# Patient Record
Sex: Female | Born: 1975 | Race: White | Hispanic: No | Smoking: Never smoker
Health system: Southern US, Community
[De-identification: ages and names within clinical notes are randomized; demographics above are authoritative.]

## PROBLEM LIST (undated history)

## (undated) DIAGNOSIS — G43509 Persistent migraine aura without cerebral infarction, not intractable, without status migrainosus: Secondary | ICD-10-CM

## (undated) DIAGNOSIS — F32A Depression, unspecified: Secondary | ICD-10-CM

## (undated) DIAGNOSIS — K219 Gastro-esophageal reflux disease without esophagitis: Secondary | ICD-10-CM

## (undated) DIAGNOSIS — E559 Vitamin D deficiency, unspecified: Secondary | ICD-10-CM

## (undated) DIAGNOSIS — K589 Irritable bowel syndrome without diarrhea: Secondary | ICD-10-CM

## (undated) DIAGNOSIS — B019 Varicella without complication: Secondary | ICD-10-CM

## (undated) DIAGNOSIS — D369 Benign neoplasm, unspecified site: Secondary | ICD-10-CM

## (undated) DIAGNOSIS — F329 Major depressive disorder, single episode, unspecified: Secondary | ICD-10-CM

## (undated) HISTORY — PX: OTHER SURGICAL HISTORY: SHX169

## (undated) HISTORY — DX: Irritable bowel syndrome, unspecified: K58.9

## (undated) HISTORY — DX: Varicella without complication: B01.9

## (undated) HISTORY — DX: Persistent migraine aura without cerebral infarction, not intractable, without status migrainosus: G43.509

## (undated) HISTORY — DX: Benign neoplasm, unspecified site: D36.9

## (undated) HISTORY — DX: Depression, unspecified: F32.A

## (undated) HISTORY — DX: Major depressive disorder, single episode, unspecified: F32.9

## (undated) HISTORY — DX: Vitamin D deficiency, unspecified: E55.9

## (undated) HISTORY — DX: Gastro-esophageal reflux disease without esophagitis: K21.9

## (undated) HISTORY — PX: ABDOMINAL HYSTERECTOMY: SHX81

---

## 1993-06-12 HISTORY — PX: CYST REMOVAL NECK: SHX6281

## 2003-06-13 HISTORY — PX: OTHER SURGICAL HISTORY: SHX169

## 2005-06-12 HISTORY — PX: CHOLECYSTECTOMY: SHX55

## 2015-09-10 ENCOUNTER — Encounter: Payer: Self-pay | Admitting: Emergency Medicine

## 2015-09-10 ENCOUNTER — Emergency Department
Admission: EM | Admit: 2015-09-10 | Discharge: 2015-09-10 | Disposition: A | Discharge status: Home / Self Care | Payer: Medicaid Other | Attending: Student | Admitting: Student

## 2015-09-10 DIAGNOSIS — T814XXA Infection following a procedure, initial encounter: Secondary | ICD-10-CM | POA: Diagnosis not present

## 2015-09-10 DIAGNOSIS — Y829 Unspecified medical devices associated with adverse incidents: Secondary | ICD-10-CM | POA: Diagnosis not present

## 2015-09-10 DIAGNOSIS — L908 Other atrophic disorders of skin: Secondary | ICD-10-CM | POA: Insufficient documentation

## 2015-09-10 DIAGNOSIS — L089 Local infection of the skin and subcutaneous tissue, unspecified: Secondary | ICD-10-CM

## 2015-09-10 DIAGNOSIS — M542 Cervicalgia: Secondary | ICD-10-CM | POA: Diagnosis present

## 2015-09-10 DIAGNOSIS — L905 Scar conditions and fibrosis of skin: Secondary | ICD-10-CM

## 2015-09-10 DIAGNOSIS — T798XXA Other early complications of trauma, initial encounter: Secondary | ICD-10-CM

## 2015-09-10 LAB — CBC WITH DIFFERENTIAL/PLATELET
BASOS ABS: 0.1 10*3/uL (ref 0–0.1)
BASOS PCT: 1 %
EOS ABS: 0.2 10*3/uL (ref 0–0.7)
Eosinophils Relative: 2 %
HCT: 42.6 % (ref 35.0–47.0)
HEMOGLOBIN: 14 g/dL (ref 12.0–16.0)
Lymphocytes Relative: 19 %
Lymphs Abs: 1.9 10*3/uL (ref 1.0–3.6)
MCH: 27.5 pg (ref 26.0–34.0)
MCHC: 32.8 g/dL (ref 32.0–36.0)
MCV: 83.6 fL (ref 80.0–100.0)
Monocytes Absolute: 0.5 10*3/uL (ref 0.2–0.9)
Monocytes Relative: 5 %
NEUTROS PCT: 73 %
Neutro Abs: 7.3 10*3/uL — ABNORMAL HIGH (ref 1.4–6.5)
Platelets: 209 10*3/uL (ref 150–440)
RBC: 5.09 MIL/uL (ref 3.80–5.20)
RDW: 13.4 % (ref 11.5–14.5)
WBC: 10 10*3/uL (ref 3.6–11.0)

## 2015-09-10 LAB — BASIC METABOLIC PANEL
Anion gap: 5 (ref 5–15)
BUN: 23 mg/dL — ABNORMAL HIGH (ref 6–20)
CALCIUM: 9.3 mg/dL (ref 8.9–10.3)
CHLORIDE: 104 mmol/L (ref 101–111)
CO2: 28 mmol/L (ref 22–32)
CREATININE: 0.86 mg/dL (ref 0.44–1.00)
Glucose, Bld: 87 mg/dL (ref 65–99)
Potassium: 3.7 mmol/L (ref 3.5–5.1)
SODIUM: 137 mmol/L (ref 135–145)

## 2015-09-10 MED ORDER — SULFAMETHOXAZOLE-TRIMETHOPRIM 800-160 MG PO TABS: 1.0000 | ORAL_TABLET | Freq: Two times a day (BID) | ORAL | Status: DC

## 2015-09-10 MED ORDER — SULFAMETHOXAZOLE-TRIMETHOPRIM 800-160 MG PO TABS
1.0000 | ORAL_TABLET | Freq: Once | ORAL | Status: AC
  Administered 2015-09-10: 1 via ORAL
  Filled 2015-09-10: qty 1

## 2015-09-10 MED ORDER — ONDANSETRON 8 MG PO TBDP
8.0000 mg | ORAL_TABLET | Freq: Once | ORAL | Status: AC
Start: 2015-09-10 — End: 2015-09-10
  Administered 2015-09-10: 8 mg via ORAL
  Filled 2015-09-10: qty 1

## 2015-09-10 MED ORDER — ONDANSETRON HCL 8 MG PO TABS: 8.0000 mg | ORAL_TABLET | Freq: Three times a day (TID) | ORAL | Status: DC | PRN

## 2015-09-10 NOTE — ED Provider Notes (Signed)
[facility] Emergency Department Provider Note  ____________________________________________  Time seen: Approximately 1:30 PM  I have reviewed the triage vital signs and the nursing notes.   HISTORY  Chief Complaint Neck Pain    HPI Linda Cochran is a 40 y.o. female patient complaining of posterior neck pain status post blunt to the tattoo parlor for "Scarification" image. Patient stated airbag came infected and she is placed on clindamycin and Bactroban. Patient state this was repeated after the first dosage was finished. Patient state she finished the last round of antibiotics 3 days ago. Patient state redness swelling and pain has returned. Patient denies any discharge from his complaint. No palliative measures for this complaint in the past 3 days. Patient rates the pain as a 6/10. Describes pain as sharp. Patient refuses any pain medication at this time.   History reviewed. No pertinent past medical history.  There are no active problems to display for this patient.   History reviewed. No pertinent past surgical history.  Current Outpatient Rx  Name  Route  Sig  Dispense  Refill  . ondansetron (ZOFRAN) 8 MG tablet   Oral   Take 1 tablet (8 mg total) by mouth every 8 (eight) hours as needed for nausea or vomiting.   20 tablet   0   . sulfamethoxazole-trimethoprim (BACTRIM DS,SEPTRA DS) 800-160 MG tablet   Oral   Take 1 tablet by mouth 2 (two) times daily.   20 tablet   0     Allergies Codeine; Dilaudid; and Penicillins  History reviewed. No pertinent family history.  Social History Social History  Substance Use Topics  . Smoking status: Never Smoker   . Smokeless tobacco: None  . Alcohol Use: No    Review of Systems Constitutional: No fever/chills Eyes: No visual changes. ENT: No sore throat. Cardiovascular: Denies chest pain. Respiratory: Denies shortness of breath. Gastrointestinal: No abdominal pain.  No nausea, no vomiting.   No diarrhea.  No constipation. Genitourinary: Negative for dysuria. Musculoskeletal: Negative for back pain. Skin: Negative for rash. Edema and erythema posterior neck. Neurological: Negative for headaches, focal weakness or numbness. Hematological/allergic to codeine, Dilaudid, and penicillins.  10-point ROS otherwise negative.  ____________________________________________   PHYSICAL EXAM:  VITAL SIGNS: ED Triage Vitals  Enc Vitals Group     BP 09/10/15 1247 133/68 mmHg     Pulse Rate 09/10/15 1247 82     Resp 09/10/15 1247 20     Temp 09/10/15 1247 98.1 F (36.7 C)     Temp Source 09/10/15 1247 Oral     SpO2 09/10/15 1247 98 %     Weight --      Height --      Head Cir --      Peak Flow --      Pain Score 09/10/15 1247 6     Pain Loc --      Pain Edu? --      Excl. in [place]? --     Constitutional: Alert and oriented. Well appearing and in no acute distress. Eyes: Conjunctivae are normal. PERRL. EOMI. Head: Atraumatic. Nose: No congestion/rhinnorhea. Mouth/Throat: Mucous membranes are moist.  Oropharynx non-erythematous. Neck: No stridor.  No cervical spine tenderness to palpation. Hematological/Lymphatic/Immunilogical: No cervical lymphadenopathy. Cardiovascular: Normal rate, regular rhythm. Grossly normal heart sounds.  Good peripheral circulation. Respiratory: Normal respiratory effort.  No retractions. Lungs CTAB. Gastrointestinal: Soft and nontender. No distention. No abdominal bruits. No CVA tenderness. Musculoskeletal: No lower extremity tenderness nor edema.  No joint effusions. Neurologic:  Normal speech and language. No gross focal neurologic deficits are appreciated. No gait instability. Skin:  Skin is warm, dry and intact. No rash noted. Psychiatric: Mood and affect are normal. Speech and behavior are normal.  ____________________________________________   LABS (all labs ordered are listed, but only abnormal results are displayed)  Labs Reviewed  BASIC  METABOLIC PANEL - Abnormal; Notable for the following:    BUN 23 (*)    All other components within normal limits  CBC WITH DIFFERENTIAL/PLATELET - Abnormal; Notable for the following:    Neutro Abs 7.3 (*)    All other components within normal limits   ____________________________________________  EKG   ____________________________________________  RADIOLOGY   ____________________________________________   PROCEDURES  Procedure(s) performed: None  Critical Care performed: No  ____________________________________________   INITIAL IMPRESSION / ASSESSMENT AND PLAN / ED COURSE  Pertinent labs & imaging results that were available during my care of the patient were reviewed by me and considered in my medical decision making (see chart for details).  Infected scar tissue. Patient given discharge care instructions. Patient given prescription for Bactrim DS and Zofran. Patient will follow with dermatology calling for an appointment next week. ____________________________________________   FINAL CLINICAL IMPRESSION(S) / ED DIAGNOSES  Final diagnoses:  Post-traumatic skin infection, initial encounter [facility])  Scar of neck       Sable Feil, PA-C 09/10/15 [place], MD 09/10/15 [id number]

## 2015-09-10 NOTE — Discharge Instructions (Signed)
Wound Infection A wound infection happens when a type of germ (bacteria) grows in a wound. Caring for the infection can help the wound heal. Wound infections need treatment. HOME CARE   Only take medicine as told by your doctor.  Take your antibiotic medicine as told. Finish it even if you start to feel better.  Clean the wound with mild soap and water as told. Rinse the soap off. Pat the area dry with a clean towel. Do not rub the wound.  Change any bandages (dressings) as told by your doctor.  Put cream and a bandage on the wound as told by your doctor.  If the bandage sticks, wet it with soapy water to remove the bandage.  Change the bandage if it gets wet, dirty, or starts to smell.  Take showers. Do not take baths, swim, or do anything that puts your wound under water.  Avoid exercise that makes you sweat.  If your wound itches, use a medicine that helps stop itching. Do not pick or scratch at the wound.  Keep all doctor visits as told. GET HELP RIGHT AWAY IF:   You have more puffiness (swelling), pain, or redness around the wound.  You have more yellowish-white fluid (pus) coming from the wound.  You have a bad smell coming from the wound.  Your wound breaks open more.  You have a fever. MAKE SURE YOU:   Understand these instructions.  Will watch your condition.  Will get help right away if you are not doing well or get worse.   This information is not intended to replace advice given to you by your health care provider. Make sure you discuss any questions you have with your health care provider.   Document Released: 03/07/2008 Document Revised: 08/21/2011 Document Reviewed: 11/16/2014 Elsevier Interactive Patient Education 2016 [place] will have a scar anytime you have surgery and a cut is made in the skin or you have something removed from your skin (mole, skin cancer, cyst). Although scars are unavoidable following surgery,  there are ways to minimize their appearance. It is important to follow all the instructions you receive from your caregiver about wound care. How your wound heals will influence the appearance of your scar. If you do not follow the wound care instructions as directed, complications such as infection may occur. Wound instructions include keeping the wound clean, moist, and not letting the wound form a scab. Some people form scars that are raised and lumpy (hypertrophic) or larger than the initial wound (keloidal). HOME CARE INSTRUCTIONS   Follow wound care instructions as directed.  Keep the wound clean by washing it with soap and water.  Keep the wound moist with provided antibiotic cream or petroleum jelly until completely healed. Moisten twice a day for about 2 weeks.  Get stitches (sutures) taken out at the scheduled time.  Avoid touching or manipulating your wound unless needed. Wash your hands thoroughly before and after touching your wound.  Follow all restrictions such as limits on exercise or work. This depends on where your scar is located.  Keep the scar protected from sunburn. Cover the scar with sunscreen/sunblock with SPF 30 or higher.  Gently massage the scar using a circular motion to help minimize the appearance of the scar. Do this only after the wound has closed and all the sutures have been removed.  For hypertrophic or keloidal scars, there are several ways to treat and minimize their appearance. Methods include compression therapy,  intralesional corticosteroids, laser therapy, or surgery. These methods are performed by your caregiver. Remember that the scar may appear lighter or darker than your normal skin color. This difference in color should even out with time. SEEK MEDICAL CARE IF:   You have a fever.  You develop signs of infection such as pain, redness, pus, and warmth.  You have questions or concerns.   This information is not intended to replace advice  given to you by your health care provider. Make sure you discuss any questions you have with your health care provider.   Document Released: 11/16/2009 Document Revised: 08/21/2011 Document Reviewed: 12/30/2014 Elsevier Interactive Patient Education [employer].

## 2015-09-10 NOTE — ED Notes (Signed)
Pt to ed with c/o neck pain after going to a tattoo parlor and getting a "scarification" in the shape of a star on the back of her neck.  Pt states about 3 weeks ago it became red, warm to touch, and painful.  Was started on abx and topical cream and reports last night, started with fever again.

## 2016-01-14 ENCOUNTER — Emergency Department: Payer: Medicaid Other

## 2016-01-14 ENCOUNTER — Encounter: Payer: Self-pay | Admitting: Emergency Medicine

## 2016-01-14 ENCOUNTER — Emergency Department
Admission: EM | Admit: 2016-01-14 | Discharge: 2016-01-15 | Disposition: A | Discharge status: Home / Self Care | Payer: Medicaid Other | Attending: Student in an Organized Health Care Education/Training Program | Admitting: Student in an Organized Health Care Education/Training Program

## 2016-01-14 DIAGNOSIS — R1032 Left lower quadrant pain: Secondary | ICD-10-CM | POA: Diagnosis present

## 2016-01-14 DIAGNOSIS — R42 Dizziness and giddiness: Secondary | ICD-10-CM

## 2016-01-14 DIAGNOSIS — R197 Diarrhea, unspecified: Secondary | ICD-10-CM

## 2016-01-14 DIAGNOSIS — R112 Nausea with vomiting, unspecified: Secondary | ICD-10-CM | POA: Insufficient documentation

## 2016-01-14 DIAGNOSIS — K529 Noninfective gastroenteritis and colitis, unspecified: Secondary | ICD-10-CM | POA: Diagnosis not present

## 2016-01-14 DIAGNOSIS — R1110 Vomiting, unspecified: Secondary | ICD-10-CM

## 2016-01-14 DIAGNOSIS — E860 Dehydration: Secondary | ICD-10-CM

## 2016-01-14 LAB — COMPREHENSIVE METABOLIC PANEL WITH GFR
ALT: 20 U/L (ref 14–54)
AST: 24 U/L (ref 15–41)
Albumin: 4.4 g/dL (ref 3.5–5.0)
Alkaline Phosphatase: 56 U/L (ref 38–126)
Anion gap: 11 (ref 5–15)
BUN: 19 mg/dL (ref 6–20)
CO2: 24 mmol/L (ref 22–32)
Calcium: 9.3 mg/dL (ref 8.9–10.3)
Chloride: 106 mmol/L (ref 101–111)
Creatinine, Ser: 0.9 mg/dL (ref 0.44–1.00)
GFR calc Af Amer: >60 mL/min
GFR calc non Af Amer: >60 mL/min
Glucose, Bld: 100 mg/dL — ABNORMAL HIGH (ref 65–99)
Potassium: 3.4 mmol/L — ABNORMAL LOW (ref 3.5–5.1)
Sodium: 141 mmol/L (ref 135–145)
Total Bilirubin: 0.7 mg/dL (ref 0.3–1.2)
Total Protein: 8 g/dL (ref 6.5–8.1)

## 2016-01-14 LAB — CBC
HEMATOCRIT: 45.2 % (ref 35.0–47.0)
Hemoglobin: 15.2 g/dL (ref 12.0–16.0)
MCH: 28.5 pg (ref 26.0–34.0)
MCHC: 33.7 g/dL (ref 32.0–36.0)
MCV: 84.8 fL (ref 80.0–100.0)
Platelets: 206 10*3/uL (ref 150–440)
RBC: 5.33 MIL/uL — ABNORMAL HIGH (ref 3.80–5.20)
RDW: 12.9 % (ref 11.5–14.5)
WBC: 17.4 10*3/uL — ABNORMAL HIGH (ref 3.6–11.0)

## 2016-01-14 LAB — URINALYSIS COMPLETE WITH MICROSCOPIC (ARMC ONLY)
Bilirubin Urine: NEGATIVE
Glucose, UA: NEGATIVE mg/dL
Leukocytes, UA: NEGATIVE
NITRITE: NEGATIVE
PH: 6 (ref 5.0–8.0)
PROTEIN: 30 mg/dL — AB
SPECIFIC GRAVITY, URINE: 1.024 (ref 1.005–1.030)

## 2016-01-14 LAB — LIPASE, BLOOD: Lipase: 25 U/L (ref 11–51)

## 2016-01-14 MED ORDER — PROMETHAZINE HCL 25 MG/ML IJ SOLN
12.5000 mg | Freq: Once | INTRAMUSCULAR | Status: AC
  Administered 2016-01-14: 12.5 mg via INTRAVENOUS
  Filled 2016-01-14: qty 1

## 2016-01-14 MED ORDER — SODIUM CHLORIDE 0.9 % IV BOLUS (SEPSIS)
1000.0000 mL | Freq: Once | INTRAVENOUS | Status: AC
  Administered 2016-01-14: 1000 mL via INTRAVENOUS

## 2016-01-14 MED ORDER — DIATRIZOATE MEGLUMINE & SODIUM 66-10 % PO SOLN
15.0000 mL | ORAL | Status: AC
Start: 2016-01-14 — End: 2016-01-15

## 2016-01-14 MED ORDER — PROMETHAZINE HCL 25 MG/ML IJ SOLN
12.5000 mg | Freq: Once | INTRAMUSCULAR | Status: AC
  Administered 2016-01-14: 12.5 mg via INTRAVENOUS

## 2016-01-14 MED ORDER — ONDANSETRON 4 MG PO TBDP
4.0000 mg | ORAL_TABLET | Freq: Once | ORAL | Status: AC | PRN
  Administered 2016-01-14: 4 mg via ORAL
  Filled 2016-01-14: qty 1

## 2016-01-14 MED ORDER — IOPAMIDOL (ISOVUE-300) INJECTION 61%
100.0000 mL | Freq: Once | INTRAVENOUS | Status: AC | PRN
  Administered 2016-01-14: 100 mL via INTRAVENOUS

## 2016-01-14 NOTE — ED Triage Notes (Signed)
Pt arrived via pov with complaints of n/v/d. Pt ambulated to triage room with no assistance. Pt states diarrhea started Wednesday, nausea started yesterday, and vomiting started today. Pt states throwing up 12-15 times today. Per pt unable to hold anything down. Pt states taking 8mg  zofran at 4 with no relief.

## 2016-01-14 NOTE — ED Provider Notes (Signed)
[facility] Emergency Department Provider Note    First MD Initiated Contact with Patient 01/14/16 2148     (approximate)  I have reviewed the triage vital signs and the nursing notes.   HISTORY  Chief Complaint Nausea; Abdominal Pain; and Dizziness    HPI Linda Cochran is a 40 y.o. female who presents with 3 days of worsening nausea vomiting. Patient states that the symptoms initially started with watery diarrhea after the patient was at [facility] park this past weekend. The diarrhea persisted patient was initially able to tolerate by mouth for the first few days, however she developed left lower quadrant pain as well as nausea and recurrent vomiting. Unable to keep anything down for the past day. Denies any fevers or chest pain. Denies any dysuria. Patient is status post hysterectomy. States that she does have ovarian cysts but states that this pain is different from previous and describes this as more of aching cramping pain. Denies any blood in her stools. States that she does have a family history of IBS.   History reviewed. No pertinent past medical history.  There are no active problems to display for this patient.   Past Surgical History:  Procedure Laterality Date  . ABDOMINAL HYSTERECTOMY    . CHOLECYSTECTOMY    . uterine prolapse repair      Prior to Admission medications   Medication Sig Start Date End Date Taking? Authorizing Provider  ondansetron (ZOFRAN) 8 MG tablet Take 1 tablet (8 mg total) by mouth every 8 (eight) hours as needed for nausea or vomiting. 09/10/15   Sable Feil, PA-C  sulfamethoxazole-trimethoprim (BACTRIM DS,SEPTRA DS) 800-160 MG tablet Take 1 tablet by mouth 2 (two) times daily. 09/10/15   Sable Feil, PA-C    Allergies Codeine; Dilaudid [hydromorphone hcl]; and Penicillins    Social History Social History  Substance Use Topics  . Smoking status: Never Smoker  . Smokeless tobacco: Not on file  .  Alcohol use No    Review of Systems Patient denies headaches, rhinorrhea, blurry vision, numbness, shortness of breath, chest pain, edema, cough, abdominal pain, nausea, vomiting, diarrhea, dysuria, fevers, rashes or hallucinations unless otherwise stated above in HPI. ____________________________________________   PHYSICAL EXAM:  VITAL SIGNS: Vitals:   01/14/16 1949  BP: 130/89  Pulse: 91  Resp: 20  Temp: 97.6 F (36.4 C)   Constitutional: Alert and oriented. Ill appearing but in NAD Eyes: Conjunctivae are normal. PERRL. EOMI. Head: Atraumatic. Nose: No congestion/rhinnorhea. Mouth/Throat: Mucous membranes are moist.  Oropharynx non-erythematous. Neck: No stridor. Painless ROM. No cervical spine tenderness to palpation Hematological/Lymphatic/Immunilogical: No cervical lymphadenopathy. Cardiovascular: Normal rate, regular rhythm. Grossly normal heart sounds.  Good peripheral circulation. Respiratory: Normal respiratory effort.  No retractions. Lungs CTAB. Gastrointestinal: Soft with no guarding, llq ttp. No distention. No abdominal bruits. No CVA tenderness. Musculoskeletal: No lower extremity tenderness nor edema.  No joint effusions. Neurologic:  Normal speech and language. No gross focal neurologic deficits are appreciated. No gait instability. Skin:  Skin is warm, dry and intact. No rash noted. Psychiatric: Mood and affect are normal. Speech and behavior are normal.  ____________________________________________   LABS (all labs ordered are listed, but only abnormal results are displayed)  Results for orders placed or performed during the hospital encounter of 01/14/16 (from the past 24 hour(s))  Lipase, blood     Status: None   Collection Time: 01/14/16  8:24 PM  Result Value Ref Range   Lipase 25 11 -  51 U/L  Comprehensive metabolic panel     Status: Abnormal   Collection Time: 01/14/16  8:24 PM  Result Value Ref Range   Sodium 141 135 - 145 mmol/L   Potassium  3.4 (L) 3.5 - 5.1 mmol/L   Chloride 106 101 - 111 mmol/L   CO2 24 22 - 32 mmol/L   Glucose, Bld 100 (H) 65 - 99 mg/dL   BUN 19 6 - 20 mg/dL   Creatinine, Ser 0.90 0.44 - 1.00 mg/dL   Calcium 9.3 8.9 - 10.3 mg/dL   Total Protein 8.0 6.5 - 8.1 g/dL   Albumin 4.4 3.5 - 5.0 g/dL   AST 24 15 - 41 U/L   ALT 20 14 - 54 U/L   Alkaline Phosphatase 56 38 - 126 U/L   Total Bilirubin 0.7 0.3 - 1.2 mg/dL   GFR calc non Af Amer >60 >60 mL/min   GFR calc Af Amer >60 >60 mL/min   Anion gap 11 5 - 15  CBC     Status: Abnormal   Collection Time: 01/14/16  8:24 PM  Result Value Ref Range   WBC 17.4 (H) 3.6 - 11.0 K/uL   RBC 5.33 (H) 3.80 - 5.20 MIL/uL   Hemoglobin 15.2 12.0 - 16.0 g/dL   HCT 45.2 35.0 - 47.0 %   MCV 84.8 80.0 - 100.0 fL   MCH 28.5 26.0 - 34.0 pg   MCHC 33.7 32.0 - 36.0 g/dL   RDW 12.9 11.5 - 14.5 %   Platelets 206 150 - 440 K/uL  Urinalysis complete, with microscopic     Status: Abnormal   Collection Time: 01/14/16  8:36 PM  Result Value Ref Range   Color, Urine YELLOW (A) YELLOW   APPearance CLEAR (A) CLEAR   Glucose, UA NEGATIVE NEGATIVE mg/dL   Bilirubin Urine NEGATIVE NEGATIVE   Ketones, ur 2+ (A) NEGATIVE mg/dL   Specific Gravity, Urine 1.024 1.005 - 1.030   Hgb urine dipstick 1+ (A) NEGATIVE   pH 6.0 5.0 - 8.0   Protein, ur 30 (A) NEGATIVE mg/dL   Nitrite NEGATIVE NEGATIVE   Leukocytes, UA NEGATIVE NEGATIVE   RBC / HPF 0-5 0 - 5 RBC/hpf   WBC, UA 0-5 0 - 5 WBC/hpf   Bacteria, UA RARE (A) NONE SEEN   Squamous Epithelial / LPF 0-5 (A) NONE SEEN   Mucous PRESENT    ____________________________________________  ____________________________________________  RADIOLOGY  IMPRESSION: 1. Fluid-filled large and small bowel without obstruction or inflammation. Findings are most consistent with diarrheal illness. 2. Small hiatal hernia. ____________________________________________   PROCEDURES  Procedure(s) performed: none    Critical Care performed:  no ____________________________________________   INITIAL IMPRESSION / ASSESSMENT AND PLAN / ED COURSE  Pertinent labs & imaging results that were available during my care of the patient were reviewed by me and considered in my medical decision making (see chart for details).  [id number], colitis, gastroenteritis, ovarian cyst, UTI, abscess  Linda Cochran is a 40 y.o. who presents to the ED with 4 days of nausea vomiting diarrhea. Laboratory evaluation ordered due to concern for acute intra-abdominal process. Patient does have marked leukocytosis but is afebrile at this time. Based on her several days of profuse diarrhea associated with nausea and vomiting and marked leukocytosis to go further imaging is clinically indicated to evaluate for diverticulitis versus colitis. Mentation is less consistent with ovarian pathology at this time. We'll order IV fluids as well as anti-emetics. Patient declines any pain medication at the  time.  Clinical Course   ----------------------------------------- 12:45 AM on 01/15/2016 -----------------------------------------  Reassessed patient remains hemodynamically stable. CT imaging reviewed with patient. Presentation more consistent with viral enteritis. Patient still with persistent nausea and vomiting. Have ordered repeat Phenergan. Will try IV Ativan. IV fluids still infusing.  Discussed with patient if next dose of antibiotics does not produce symptoms will need admission to hospital for hyperemesis.  Case discussed with Dr. Dahlia Client in sign out.  Have discussed with the patient and available family all diagnostics and treatments performed thus far and all questions were answered to the best of my ability. The patient demonstrates understanding and agreement with plan.   ____________________________________________   FINAL CLINICAL IMPRESSION(S) / ED DIAGNOSES  Final diagnoses:  Intractable vomiting with nausea, vomiting of unspecified type   Gastroenteritis      NEW MEDICATIONS STARTED DURING THIS VISIT:  New Prescriptions   No medications on file     Note:  This document was prepared using Dragon voice recognition software and may include unintentional dictation errors.    Merlyn Lot, MD 01/15/16 [id number]

## 2016-01-14 NOTE — ED Notes (Signed)
Patient transported to CT 

## 2016-01-14 NOTE — ED Notes (Signed)
Pt reports contract making nausea worse, confirmed with MD to proceed with CT without, CT called

## 2016-01-15 MED ORDER — HALOPERIDOL LACTATE 5 MG/ML IJ SOLN: 5.0000 mg | Freq: Once | INTRAMUSCULAR | Status: DC

## 2016-01-15 MED ORDER — LORAZEPAM 2 MG/ML IJ SOLN
2.0000 mg | Freq: Once | INTRAMUSCULAR | Status: AC
  Administered 2016-01-15: 2 mg via INTRAVENOUS
  Filled 2016-01-15: qty 1

## 2016-01-15 MED ORDER — KCL IN DEXTROSE-NACL 20-5-0.45 MEQ/L-%-% IV SOLN
Freq: Once | INTRAVENOUS | Status: AC
  Administered 2016-01-15: 01:00:00 via INTRAVENOUS
  Filled 2016-01-15: qty 1000

## 2016-01-15 MED ORDER — LORAZEPAM 1 MG PO TABS: 1.0000 mg | ORAL_TABLET | Freq: Once | ORAL | Status: DC

## 2016-01-15 MED ORDER — LORAZEPAM 0.5 MG PO TABS
0.5000 mg | ORAL_TABLET | Freq: Three times a day (TID) | ORAL | 0 refills | Status: AC | PRN
Start: 2016-01-15 — End: 2017-01-14

## 2016-01-15 NOTE — ED Provider Notes (Signed)
-----------------------------------------   4:32 AM on 01/15/2016 -----------------------------------------   Blood pressure 108/78, pulse 93, temperature 97.6 F (36.4 C), temperature source Oral, resp. rate 16, height 5\' 6"  (1.676 m), weight 182 lb (82.6 kg), SpO2 97 %.  Assuming care from Dr. Quentin Cornwall.  In short, Linda Cochran is a 40 y.o. female with a chief complaint of Nausea; Abdominal Pain; and Dizziness .  Refer to the original H&P for additional details.  The current plan of care is to reassess the patient after she's receive her fluids.  Patient did receive a dose of Ativan from Dr. Quentin Cornwall for her nausea and dizziness as well which was helpful. After multiple hours in the ER the patient's fluids are not running quickly and continually occlude. As the patient is feeling better and has been able to tolerate some sips of fluid I will discharge her home and have her orally hydrate at home. The patient should follow-up with her primary care physician. The patient agrees with this plan as stated and she'll be discharged to home.    Loney Hering, MD 01/15/16 [phone]

## 2017-01-10 DIAGNOSIS — G43909 Migraine, unspecified, not intractable, without status migrainosus: Secondary | ICD-10-CM | POA: Insufficient documentation

## 2017-01-10 DIAGNOSIS — R42 Dizziness and giddiness: Secondary | ICD-10-CM | POA: Insufficient documentation

## 2017-02-08 DIAGNOSIS — K6289 Other specified diseases of anus and rectum: Secondary | ICD-10-CM | POA: Insufficient documentation

## 2017-06-12 HISTORY — PX: COLONOSCOPY: SHX174

## 2017-08-21 ENCOUNTER — Other Ambulatory Visit: Payer: Self-pay | Admitting: Physician Assistant

## 2017-08-21 DIAGNOSIS — Z1231 Encounter for screening mammogram for malignant neoplasm of breast: Secondary | ICD-10-CM

## 2017-09-10 ENCOUNTER — Ambulatory Visit: Payer: Medicaid Other

## 2017-09-19 ENCOUNTER — Ambulatory Visit: Payer: Medicaid Other

## 2017-10-15 ENCOUNTER — Ambulatory Visit: Payer: Self-pay

## 2018-04-15 ENCOUNTER — Telehealth: Payer: Self-pay | Admitting: Obstetrics & Gynecology

## 2018-04-15 NOTE — Telephone Encounter (Signed)
We have received records for [facility], Attempt to reach patient to schedule transfer care lvm per Kenney Houseman. 04/12/18.

## 2018-04-24 ENCOUNTER — Ambulatory Visit: Payer: Medicaid Other

## 2018-05-13 ENCOUNTER — Ambulatory Visit (INDEPENDENT_AMBULATORY_CARE_PROVIDER_SITE_OTHER): Payer: Medicaid Other | Admitting: Maternal Newborn

## 2018-05-13 ENCOUNTER — Other Ambulatory Visit (HOSPITAL_COMMUNITY)
Admission: RE | Admit: 2018-05-13 | Discharge: 2018-05-13 | Disposition: A | Discharge status: Home / Self Care | Payer: Medicaid Other | Source: Ambulatory Visit | Attending: Maternal Newborn | Admitting: Maternal Newborn

## 2018-05-13 ENCOUNTER — Encounter: Payer: Self-pay | Admitting: Maternal Newborn

## 2018-05-13 VITALS — BP 118/72 | HR 95 | Ht 66.0 in | Wt 197.0 lb

## 2018-05-13 DIAGNOSIS — K582 Mixed irritable bowel syndrome: Secondary | ICD-10-CM

## 2018-05-13 DIAGNOSIS — R350 Frequency of micturition: Secondary | ICD-10-CM | POA: Diagnosis not present

## 2018-05-13 DIAGNOSIS — Z01419 Encounter for gynecological examination (general) (routine) without abnormal findings: Secondary | ICD-10-CM | POA: Insufficient documentation

## 2018-05-13 DIAGNOSIS — N393 Stress incontinence (female) (male): Secondary | ICD-10-CM

## 2018-05-13 DIAGNOSIS — Z0000 Encounter for general adult medical examination without abnormal findings: Secondary | ICD-10-CM | POA: Diagnosis not present

## 2018-05-13 DIAGNOSIS — Z113 Encounter for screening for infections with a predominantly sexual mode of transmission: Secondary | ICD-10-CM | POA: Insufficient documentation

## 2018-05-13 DIAGNOSIS — K6289 Other specified diseases of anus and rectum: Secondary | ICD-10-CM

## 2018-05-13 LAB — POCT URINALYSIS DIPSTICK
BILIRUBIN UA: NEGATIVE
Glucose, UA: NEGATIVE
KETONES UA: NEGATIVE
Leukocytes, UA: NEGATIVE
Nitrite, UA: NEGATIVE
PH UA: 6.5 (ref 5.0–8.0)
Protein, UA: NEGATIVE
RBC UA: NEGATIVE
Spec Grav, UA: 1.015 (ref 1.010–1.025)
UROBILINOGEN UA: 0.2 U/dL

## 2018-05-13 NOTE — Progress Notes (Signed)
Gynecology Annual Exam  PCP: Linda Cochran, No Pcp Per  Chief Complaint:  Chief Complaint  Linda Cochran presents with  . Gynecologic Exam    urine urgency, leaking urine     History of Present Illness: Linda Cochran is a 42 y.o. [id number] presenting for an annual exam.   LMP: No LMP recorded. Linda Cochran has had a hysterectomy.  The Linda Cochran is sexually active; currently has a female partner. She is status post hysterectomy and thus does not need contraception. She denies dyspareunia.  The Linda Cochran does perform self breast exams. There is a history of breast cancer in her paternal aunt. There is no other family history of breast or ovarian cancer in her family.  The Linda Cochran has regular exercise: Not currently, beginning to swim as regular exercise.  The Linda Cochran denies current symptoms of depression.    Review of Systems  Constitutional: Negative.   HENT: Negative.   Eyes: Negative.   Respiratory: Positive for cough and shortness of breath. Negative for wheezing.        Recovering from bronchitis  Cardiovascular: Negative for chest pain and palpitations.  Gastrointestinal: Positive for constipation and diarrhea.       IBS  Genitourinary: Positive for frequency.       Stress urinary incontinence  Skin: Negative.   Neurological: Positive for headaches.       Migraines  Endo/Heme/Allergies: Bruises/bleeds easily.       Hot flashes  Psychiatric/Behavioral: Negative for depression. The Linda Cochran is nervous/anxious.   All other systems reviewed and are negative.   Past Medical History:  Past Medical History:  Diagnosis Date  . Acid reflux   . Chickenpox   . Depression   . IBS (irritable bowel syndrome)   . Migraine aura, persistent     Past Surgical History:  Past Surgical History:  Procedure Laterality Date  . ABDOMINAL HYSTERECTOMY    . CESAREAN SECTION  2005  . CHOLECYSTECTOMY  2007  . CYST REMOVAL NECK  1995  . laproscopy  2005  . uterine prolapse repair      Gynecologic History:    No LMP recorded. Linda Cochran has had a hysterectomy. Contraception: status post hysterectomy Last Pap: 05/18/2015. Results were: ASCUS/HPV Negative  Last mammogram: 2018.  Results were normal per records review.  Obstetric History: G2P1103  Family History:  Family History  Problem Relation Age of Onset  . Kidney failure Mother   . Hypertension Mother   . Migraines Mother   . Skin cancer Mother   . Cervical cancer Mother   . Diabetes Mother   . Hypertension Father   . Diabetes Father   . Breast cancer Paternal Aunt   . Brain cancer Paternal Aunt   . Uterine cancer Maternal Grandmother   . Kidney failure Maternal Grandmother   . Colon cancer Maternal Grandfather   . Skin cancer Maternal Grandfather   . Diabetes Maternal Grandfather   . Kidney failure Paternal Grandfather     Social History:  Social History   Socioeconomic History  . Marital status: Single    Spouse name: Not on file  . Number of children: Not on file  . Years of education: Not on file  . Highest education level: Not on file  Occupational History  . Not on file  Social Needs  . Financial resource strain: Not on file  . Food insecurity:    Worry: Not on file    Inability: Not on file  . Transportation needs:    Medical: Not on file  Non-medical: Not on file  Tobacco Use  . Smoking status: Never Smoker  . Smokeless tobacco: Never Used  Substance and Sexual Activity  . Alcohol use: No  . Drug use: No  . Sexual activity: Yes    Birth control/protection: Surgical  Lifestyle  . Physical activity:    Days per week: Not on file    Minutes per session: Not on file  . Stress: Not on file  Relationships  . Social connections:    Talks on phone: Not on file    Gets together: Not on file    Attends religious service: Not on file    Active member of club or organization: Not on file    Attends meetings of clubs or organizations: Not on file    Relationship status: Not on file  . Intimate partner  violence:    Fear of current or ex partner: Not on file    Emotionally abused: Not on file    Physically abused: Not on file    Forced sexual activity: Not on file  Other Topics Concern  . Not on file  Social History Narrative  . Not on file    Allergies:  Allergies  Allergen Reactions  . Methylprednisolone Other (See Comments)    Paralytic reaction  . Codeine   . Dilaudid [Hydromorphone Hcl]   . Penicillins   . Pantoprazole Sodium Diarrhea and Nausea Only    Stomach cramps    Medications: Prior to Admission medications   Medication Sig Start Date End Date Taking? Authorizing Provider  amitriptyline (ELAVIL) 50 MG tablet TAKE 1 TABLET BY MOUTH EVERYDAY AT BEDTIME 03/28/18  Yes [provider]  cyclobenzaprine (FLEXERIL) 10 MG tablet TAKE 1 TABLET BY MOUTH 3 TIMES A DAY AS NEEDED FOR MUSCLE SPASMS FOR UP TO 10 DAYS. 04/10/18  Yes [provider]  diazepam (VALIUM) 2 MG tablet TAKE 1 TABLET BY MOUTH EVERY 12 HOURS AS NEEDED FOR VERTIGO 05/10/18  Yes [provider]  meloxicam (MOBIC) 15 MG tablet TAKE 1 TABLET BY MOUTH EVERY DAY AS NEEDED FOR PAIN FOR UP TO 30 DAYS 04/19/18  Yes [provider]  omeprazole (PRILOSEC OTC) 20 MG tablet Take by mouth.   Yes [provider]  ondansetron (ZOFRAN-ODT) 8 MG disintegrating tablet Take 8 mg by mouth every 8 (eight) hours as needed. for nausea 03/20/18  Yes [provider]  SUMAtriptan (IMITREX) 100 MG tablet Take by mouth. 12/12/17  Yes [provider]    Physical Exam Vitals: Blood pressure 118/72, pulse 95, height 5\' 6"  (1.676 m), weight 197 lb (89.4 kg).  General: NAD HEENT: normocephalic, anicteric Thyroid: no enlargement Pulmonary: No increased work of breathing, CTAB Cardiovascular: RRR, no murmurs, rubs, or gallops Breast: Breasts symmetrical, no tenderness, no palpable nodules or masses, no skin or nipple retraction present, no nipple discharge.  No axillary or  supraclavicular lymphadenopathy. Abdomen: Soft, non-tender, non-distended.  Umbilicus without lesions.  No hepatomegaly, splenomegaly or masses palpable. No evidence of hernia  Genitourinary:  External: Normal external female genitalia.  Normal urethral  meatus, normal Bartholin's and Skene's glands.    Vagina: Normal vaginal mucosa, no evidence of prolapse.    Cervix: Surgically absent  Uterus: Surgically absent  Adnexa: ovaries non-enlarged, no adnexal masses  Rectal: deferred  Lymphatic: no evidence of inguinal lymphadenopathy Extremities: no edema, erythema, or tenderness Neurologic: Grossly intact Psychiatric: mood appropriate, affect full  Assessment: 42 y.o. [id number] routine annual exam with rectal pain, IBS, and stress urinary  incontinence.  Plan: Problem List Items Addressed This Visit    None    Visit Diagnoses    Women's annual routine gynecological examination    -  Primary   Relevant Orders   HEP, RPR, HIV Panel   Cytology - PAP   Frequent urination       Relevant Orders   POCT Urinalysis Dipstick (Completed)   Screen for STD (sexually transmitted disease)       Relevant Orders   HEP, RPR, HIV Panel   Cytology - PAP   Pap smear, as part of routine gynecological examination       Relevant Orders   Cytology - PAP   Irritable bowel syndrome with both constipation and diarrhea       Relevant Medications   ondansetron (ZOFRAN-ODT) 8 MG disintegrating tablet   omeprazole (PRILOSEC OTC) 20 MG tablet   Other Relevant Orders   Ambulatory referral to Gastroenterology   Rectal pain       Relevant Orders   Ambulatory referral to Gastroenterology      1) Mammogram - recommend yearly screening mammogram.  Mammogram has been ordered by her primary physican to be done at [facility].  2) STI screening was offered and accepted.  3) ASCCP guidelines and rationale discussed.  Linda Cochran opts for every 3 year screening interval.  4) Referral to gastroenterology for  new rectal pain since having surgery to remove a growth and ongoing IBS issues (predominantly constipation but also has diarrhea).  5) Discussed causes and treatment of stress urinary incontinence. Handout given on pelvic floor muscle exercises and encouraged 3 x daily exercises. UA negative today.  6) Routine healthcare maintenance including cholesterol, diabetes screening: managed by PCP.  Return in about 1 year (around 05/14/2019) for Annual exam.  Avel Sensor, CNM 05/13/2018  4:21 PM

## 2018-05-14 LAB — HEP, RPR, HIV PANEL
HIV SCREEN 4TH GENERATION: NONREACTIVE
Hepatitis B Surface Ag: NEGATIVE
RPR: NONREACTIVE

## 2018-05-15 LAB — CYTOLOGY - PAP
CHLAMYDIA, DNA PROBE: NEGATIVE
Diagnosis: NEGATIVE
HPV: NOT DETECTED
NEISSERIA GONORRHEA: NEGATIVE
Trichomonas: NEGATIVE

## 2018-06-18 ENCOUNTER — Ambulatory Visit
Admission: RE | Admit: 2018-06-18 | Discharge: 2018-06-18 | Disposition: A | Discharge status: Home / Self Care | Payer: Medicaid Other | Source: Ambulatory Visit | Attending: Physician Assistant | Admitting: Physician Assistant

## 2018-06-18 DIAGNOSIS — Z1231 Encounter for screening mammogram for malignant neoplasm of breast: Secondary | ICD-10-CM

## 2018-06-20 ENCOUNTER — Ambulatory Visit: Payer: Medicaid Other | Admitting: Gastroenterology

## 2018-07-16 ENCOUNTER — Other Ambulatory Visit: Payer: Self-pay

## 2018-07-16 ENCOUNTER — Ambulatory Visit: Payer: Medicaid Other | Admitting: Gastroenterology

## 2018-07-16 ENCOUNTER — Encounter: Payer: Self-pay | Admitting: Gastroenterology

## 2018-07-16 VITALS — BP 107/76 | HR 71 | Resp 17 | Ht 66.0 in | Wt 186.6 lb

## 2018-07-16 DIAGNOSIS — K580 Irritable bowel syndrome with diarrhea: Secondary | ICD-10-CM | POA: Diagnosis not present

## 2018-07-16 MED ORDER — AMITRIPTYLINE HCL 50 MG PO TABS: ORAL_TABLET | ORAL | 0 refills | Status: DC

## 2018-07-16 NOTE — Progress Notes (Signed)
Cephas Darby, MD [address]  [place]  [place], [place] [address]  Main: [phone]  Fax: [phone]    Gastroenterology Consultation  Referring Provider:     Rexene Agent, CNM Primary Care Physician:  Nicholes Rough, PA-C Primary Gastroenterologist:  Dr. Cephas Darby Reason for Consultation:     IBS flare up        HPI:   Linda Cochran is a 43 y.o. caucasian female referred by Dr. Nicholes Rough, PA-C  for consultation & management of IBS flare up Abdominal pain, cramps, generalized, post prandial urgency past 77months,worse past 3weeks. Poor po intake, nausea, lightheaded. Diagnosed with IBS since she was young, alternating constipation and diarrhea, was using miralax when constipated. She tried keto diet before Thanksgiving as she gained upto 207lbs, her IBS symptoms got worse, quit keto diet after 3 weeks, but symptoms only partially relieved. She tried bentyl 10mg , developed constipation. Her symptoms were manageable prior to going on keto diet. She switched from amitriptyline to propranolol 3weeks ago as her migraines were not under control. She was put on amitriptyline by her neurologist about 3 years ago, had been taking 50mg , couldn't increase the dose due to mood swings and insomnia  195lbs on 06/24/18, 186.6 lbs today Had traumatic vagina delivery with her twin pregnancy Had uterine prolapse, underwent hysterectomy  She was seeing GI in [place], EGD in 2018, gstric polyp was removed, started on omeprazole for heart burn  Had colonoscopy in 2018 at [facility], removal of anal papilla surgically, post op thrombosed external hemorrhoid  NSAIDs: none  Antiplts/Anticoagulants/Anti thrombotics: none  GI Procedures: EGD in [place] in 2018 Colonoscopy in 2018 at [facility] removed surgically  Past Medical History:  Diagnosis Date  . Acid reflux   . Chickenpox   . Depression   . IBS (irritable bowel syndrome)   . Migraine aura, persistent      Past Surgical History:  Procedure Laterality Date  . ABDOMINAL HYSTERECTOMY    . CESAREAN SECTION  2005  . CHOLECYSTECTOMY  2007  . CYST REMOVAL NECK  1995  . laproscopy  2005  . uterine prolapse repair      Current Outpatient Medications:  .  cyclobenzaprine (FLEXERIL) 10 MG tablet, TAKE 1 TABLET BY MOUTH 3 TIMES A DAY AS NEEDED FOR MUSCLE SPASMS FOR UP TO 10 DAYS., Disp: , Rfl: 1 .  diazepam (VALIUM) 2 MG tablet, TAKE 1 TABLET BY MOUTH EVERY 12 HOURS AS NEEDED FOR VERTIGO, Disp: , Rfl:  .  omeprazole (PRILOSEC OTC) 20 MG tablet, Take by mouth., Disp: , Rfl:  .  ondansetron (ZOFRAN-ODT) 8 MG disintegrating tablet, Take 8 mg by mouth every 8 (eight) hours as needed. for nausea, Disp: , Rfl: 3 .  propranolol ER (INDERAL LA) 60 MG 24 hr capsule, Take by mouth., Disp: , Rfl:  .  rizatriptan (MAXALT-MLT) 10 MG disintegrating tablet, Take by mouth., Disp: , Rfl:  .  amitriptyline (ELAVIL) 50 MG tablet, TAKE 1 TABLET BY MOUTH EVERYDAY AT BEDTIME, Disp: 90 tablet, Rfl: 0 .  meloxicam (MOBIC) 15 MG tablet, TAKE 1 TABLET BY MOUTH EVERY DAY AS NEEDED FOR PAIN FOR UP TO 30 DAYS, Disp: , Rfl: 1 .  SUMAtriptan (IMITREX) 100 MG tablet, Take by mouth., Disp: , Rfl:  .  topiramate (TOPAMAX) 50 MG tablet, Take 50 mg by mouth at bedtime., Disp: , Rfl:    Family History  Problem Relation Age of Onset  . Kidney failure Mother   .  Hypertension Mother   . Migraines Mother   . Skin cancer Mother   . Cervical cancer Mother   . Diabetes Mother   . Hypertension Father   . Diabetes Father   . Breast cancer Paternal Aunt   . Brain cancer Paternal Aunt   . Uterine cancer Maternal Grandmother   . Kidney failure Maternal Grandmother   . Colon cancer Maternal Grandfather   . Skin cancer Maternal Grandfather   . Diabetes Maternal Grandfather   . Kidney failure Paternal Grandfather      Social History   Tobacco Use  . Smoking status: Never Smoker  . Smokeless tobacco: Never Used  Substance Use  Topics  . Alcohol use: No  . Drug use: No    Allergies as of 07/16/2018 - Review Complete 07/16/2018  Allergen Reaction Noted  . Methylprednisolone Other (See Comments) 01/17/2017  . Codeine  09/10/2015  . Dilaudid [hydromorphone hcl]  09/10/2015  . Penicillins  09/10/2015  . Pantoprazole sodium Diarrhea and Nausea Only 01/10/2017    Review of Systems:    All systems reviewed and negative except where noted in HPI.   Physical Exam:  BP 107/76 (BP Location: Left Arm, Patient Position: Sitting, Cuff Size: Large)   Pulse 71   Resp 17   Ht 5\' 6"  (1.676 m)   Wt 186 lb 9.6 oz (84.6 kg)   BMI 30.12 kg/m  No LMP recorded. Patient has had a hysterectomy.  General:   Alert,  Well-developed, well-nourished, pleasant and cooperative in NAD Head:  Normocephalic and atraumatic. Eyes:  Sclera clear, no icterus.   Conjunctiva pink. Ears:  Normal auditory acuity. Nose:  No deformity, discharge, or lesions. Mouth:  No deformity or lesions,oropharynx pink & moist. Neck:  Supple; no masses or thyromegaly. Lungs:  Respirations even and unlabored.  Clear throughout to auscultation.   No wheezes, crackles, or rhonchi. No acute distress. Heart:  Regular rate and rhythm; no murmurs, clicks, rubs, or gallops. Abdomen:  Normal bowel sounds. Soft, mild diffuse tenderness and non-distended without masses, hepatosplenomegaly or hernias noted.  No guarding or rebound tenderness.   Rectal: Not performed Msk:  Symmetrical without gross deformities. Good, equal movement & strength bilaterally. Pulses:  Normal pulses noted. Extremities:  No clubbing or edema.  No cyanosis. Neurologic:  Alert and oriented x3;  grossly normal neurologically. Skin:  Intact without significant lesions or rashes. No jaundice. Psych:  Alert and cooperative. Normal mood and affect.  Imaging Studies: Reviewed  Assessment and Plan:   Linda Cochran is a 43 y.o. white female with h/o IBS-mixed type, chronic migraine HAs,  previously on amitriptyline 50mg , recently switched to propranolol 60mg  and pt is not tolerating it well. Her migraines are not under control. I think her worsening of symptoms are probably secondary to stopping amitriptyline.  Pt is willing to restart amitriptyline, will restart at her previous dose. Discontinue propranolol as it does not seem to be helping.    Follow up in 3 months   Cephas Darby, MD

## 2018-07-19 ENCOUNTER — Ambulatory Visit: Payer: Medicaid Other | Admitting: Gastroenterology

## 2018-10-15 ENCOUNTER — Ambulatory Visit: Payer: Medicaid Other | Admitting: Gastroenterology

## 2018-11-23 ENCOUNTER — Other Ambulatory Visit: Payer: Self-pay | Admitting: Gastroenterology

## 2018-11-23 DIAGNOSIS — K580 Irritable bowel syndrome with diarrhea: Secondary | ICD-10-CM

## 2018-12-14 ENCOUNTER — Other Ambulatory Visit: Payer: Self-pay | Admitting: Gastroenterology

## 2018-12-14 DIAGNOSIS — K580 Irritable bowel syndrome with diarrhea: Secondary | ICD-10-CM

## 2018-12-26 ENCOUNTER — Ambulatory Visit: Payer: Medicaid Other | Admitting: Gastroenterology

## 2019-09-30 DIAGNOSIS — M0609 Rheumatoid arthritis without rheumatoid factor, multiple sites: Secondary | ICD-10-CM | POA: Insufficient documentation

## 2019-11-19 ENCOUNTER — Ambulatory Visit: Payer: Medicaid Other | Admitting: Obstetrics and Gynecology

## 2019-12-17 ENCOUNTER — Ambulatory Visit (INDEPENDENT_AMBULATORY_CARE_PROVIDER_SITE_OTHER): Payer: Medicaid Other | Admitting: Obstetrics and Gynecology

## 2019-12-17 ENCOUNTER — Other Ambulatory Visit (HOSPITAL_COMMUNITY)
Admission: RE | Admit: 2019-12-17 | Discharge: 2019-12-17 | Disposition: A | Discharge status: Home / Self Care | Payer: Medicaid Other | Source: Ambulatory Visit | Attending: Obstetrics and Gynecology | Admitting: Obstetrics and Gynecology

## 2019-12-17 ENCOUNTER — Encounter: Payer: Self-pay | Admitting: Obstetrics and Gynecology

## 2019-12-17 ENCOUNTER — Other Ambulatory Visit: Payer: Self-pay

## 2019-12-17 VITALS — BP 100/70 | Ht 65.5 in | Wt 189.0 lb

## 2019-12-17 DIAGNOSIS — Z124 Encounter for screening for malignant neoplasm of cervix: Secondary | ICD-10-CM | POA: Insufficient documentation

## 2019-12-17 DIAGNOSIS — Z1231 Encounter for screening mammogram for malignant neoplasm of breast: Secondary | ICD-10-CM

## 2019-12-17 DIAGNOSIS — Z01419 Encounter for gynecological examination (general) (routine) without abnormal findings: Secondary | ICD-10-CM

## 2019-12-17 DIAGNOSIS — Z131 Encounter for screening for diabetes mellitus: Secondary | ICD-10-CM

## 2019-12-17 DIAGNOSIS — Z1151 Encounter for screening for human papillomavirus (HPV): Secondary | ICD-10-CM | POA: Insufficient documentation

## 2019-12-17 DIAGNOSIS — N951 Menopausal and female climacteric states: Secondary | ICD-10-CM

## 2019-12-17 DIAGNOSIS — Z0000 Encounter for general adult medical examination without abnormal findings: Secondary | ICD-10-CM

## 2019-12-17 DIAGNOSIS — Z803 Family history of malignant neoplasm of breast: Secondary | ICD-10-CM | POA: Insufficient documentation

## 2019-12-17 DIAGNOSIS — Z780 Asymptomatic menopausal state: Secondary | ICD-10-CM

## 2019-12-17 NOTE — Patient Instructions (Signed)
I value your feedback and entrusting us with your care. If you get a [facility] patient survey, I would appreciate you taking the time to let us know about your experience today. Thank you!  As of May 22, 2019, your lab results will be released to your MyChart immediately, before I even have a chance to see them. Please give me time to review them and contact you if there are any abnormalities. Thank you for your patience.   [facility]: [phone]  [facility]: [phone]  

## 2019-12-17 NOTE — Progress Notes (Signed)
PCP:  Nicholes Rough, PA-C   Chief Complaint  Patient presents with  . Gynecologic Exam    hot flashes are epic per pt, weight gain     HPI:      Ms. Linda Cochran is a 44 y.o. [id number] whose LMP was No LMP recorded. Patient has had a hysterectomy., presents today for her annual examination.  Her menses are absent due to [facility] in 2013 due to uterine prolapse and mass/AUB. No PMB, cramping. She has been having terrible hot flashes and night sweats, as well as joint aches, difficulty sleeping and wt gain the past 3 months. Had normal TSH, CMP, CBC with different MDs in past few months. Seeing rheumatology due to arthralgias. Also has a hx of migraines, worse recently with "hormonal changes".   Sex activity: single partner, contraception - status post hysterectomy.  Last Pap: not recent; hx of cx dysplasia prior to [facility]; no recent pap but was getting them in [place] with previous GYN after hyst. Hx of STDs: HPV  Last mammogram: 06/18/18  Results were: normal--routine follow-up in 12 months There is a FH of breast cancer in her pat aunt. Pt states she had neg genetic testing with previous GYN about 5 yrs ago. Question BRCA only vs panel (pt to see if she can get records). There is no FH of ovarian cancer. The patient does do self-breast exams.  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   Tobacco use: The patient denies current or previous tobacco use. Alcohol use: social drinker No drug use.  Exercise: moderately active  She does get adequate calcium and Vitamin D in her diet. Hx of Vit D deficiency.    Past Medical History:  Diagnosis Date  . Acid reflux   . Chickenpox   . Depression   . IBS (irritable bowel syndrome)   . Migraine aura, persistent   . Papilloma    anus  . Vitamin D deficiency     Past Surgical History:  Procedure Laterality Date  . ABDOMINAL HYSTERECTOMY    . CESAREAN SECTION  2005  . CHOLECYSTECTOMY  2007  . COLONOSCOPY  2019    . CYST REMOVAL NECK  1995  . laproscopy  2005  . uterine prolapse repair      Family History  Problem Relation Age of Onset  . Kidney failure Mother   . Hypertension Mother   . Migraines Mother   . Skin cancer Mother   . Cervical cancer Mother   . Diabetes Mother   . Hypertension Father   . Diabetes Father   . Breast cancer Paternal Aunt   . Brain cancer Paternal Aunt   . Uterine cancer Maternal Grandmother   . Kidney failure Maternal Grandmother   . Colon cancer Maternal Grandfather   . Skin cancer Maternal Grandfather   . Diabetes Maternal Grandfather   . Kidney failure Paternal Grandfather     Social History   Socioeconomic History  . Marital status: Single    Spouse name: Not on file  . Number of children: Not on file  . Years of education: Not on file  . Highest education level: Not on file  Occupational History  . Not on file  Tobacco Use  . Smoking status: Never Smoker  . Smokeless tobacco: Never Used  Vaping Use  . Vaping Use: Never used  Substance and Sexual Activity  . Alcohol use: No  . Drug use: No  . Sexual activity:  Yes    Birth control/protection: Surgical    Comment: Hysterectomy  Other Topics Concern  . Not on file  Social History Narrative  . Not on file   Social Determinants of Health   Financial Resource Strain:   . Difficulty of Paying Living Expenses:   Food Insecurity:   . Worried About Charity fundraiser in the Last Year:   . Arboriculturist in the Last Year:   Transportation Needs:   . Film/video editor (Medical):   Marland Kitchen Lack of Transportation (Non-Medical):   Physical Activity:   . Days of Exercise per Week:   . Minutes of Exercise per Session:   Stress:   . Feeling of Stress :   Social Connections:   . Frequency of Communication with Friends and Family:   . Frequency of Social Gatherings with Friends and Family:   . Attends Religious Services:   . Active Member of Clubs or Organizations:   . Attends Theatre manager Meetings:   Marland Kitchen Marital Status:   Intimate Partner Violence:   . Fear of Current or Ex-Partner:   . Emotionally Abused:   Marland Kitchen Physically Abused:   . Sexually Abused:      Current Outpatient Medications:  .  amitriptyline (ELAVIL) 50 MG tablet, TAKE 1 TABLET BY MOUTH EVERYDAY AT BEDTIME, Disp: 90 tablet, Rfl: 0 .  cyclobenzaprine (FLEXERIL) 10 MG tablet, TAKE 1 TABLET BY MOUTH 3 TIMES A DAY AS NEEDED FOR MUSCLE SPASMS FOR UP TO 10 DAYS., Disp: , Rfl: 1 .  diazepam (VALIUM) 2 MG tablet, TAKE 1 TABLET BY MOUTH EVERY 12 HOURS AS NEEDED FOR VERTIGO, Disp: , Rfl:  .  eletriptan (RELPAX) 40 MG tablet, TAKE 1 TABLET BY MOUTH AS NEEDED. MAY REPEAT IN 2 HOURS IF NECESSARY. NO MORE THAN 2 IN 24 HOURS., Disp: , Rfl:  .  meclizine (ANTIVERT) 12.5 MG tablet, Take by mouth., Disp: , Rfl:  .  montelukast (SINGULAIR) 10 MG tablet, Take by mouth., Disp: , Rfl:  .  omeprazole (PRILOSEC OTC) 20 MG tablet, Take by mouth., Disp: , Rfl:  .  ondansetron (ZOFRAN-ODT) 8 MG disintegrating tablet, Take 8 mg by mouth every 8 (eight) hours as needed. for nausea, Disp: , Rfl: 3 .  promethazine (PHENERGAN) 25 MG suppository, Place rectally., Disp: , Rfl:  .  triamcinolone (NASACORT) 55 MCG/ACT AERO nasal inhaler, two sprays by Both Nostrils route daily., Disp: , Rfl:      ROS:  Review of Systems  Constitutional: Positive for fatigue and unexpected weight change. Negative for fever.  Respiratory: Negative for cough, shortness of breath and wheezing.   Cardiovascular: Negative for chest pain, palpitations and leg swelling.  Gastrointestinal: Negative for blood in stool, constipation, diarrhea, nausea and vomiting.  Endocrine: Negative for cold intolerance, heat intolerance and polyuria.  Genitourinary: Positive for frequency. Negative for dyspareunia, dysuria, flank pain, genital sores, hematuria, menstrual problem, pelvic pain, urgency, vaginal bleeding, vaginal discharge and vaginal pain.    Musculoskeletal: Positive for arthralgias. Negative for back pain, joint swelling and myalgias.  Skin: Negative for rash.  Neurological: Positive for headaches. Negative for dizziness, syncope, light-headedness and numbness.  Hematological: Negative for adenopathy.  Psychiatric/Behavioral: Positive for agitation. Negative for confusion, sleep disturbance and suicidal ideas. The patient is not nervous/anxious.   BREAST: No symptoms   Objective: BP 100/70   Ht 5' 5.5" (1.664 m)   Wt 189 lb (85.7 kg)   BMI 30.97 kg/m    Physical  Exam Constitutional:      Appearance: She is well-developed.  Genitourinary:     Vulva, vagina, right adnexa and left adnexa normal.     No vaginal discharge, erythema or tenderness.     Cervix is absent.     Uterus is absent.     No right or left adnexal mass present.     Right adnexa not tender.     Left adnexa not tender.     Genitourinary Comments: UTERUS/CX SURG REM  Neck:     Thyroid: No thyromegaly.  Cardiovascular:     Rate and Rhythm: Normal rate and regular rhythm.     Heart sounds: Normal heart sounds. No murmur heard.   Pulmonary:     Effort: Pulmonary effort is normal.     Breath sounds: Normal breath sounds.  Chest:     Breasts:        Right: No mass, nipple discharge, skin change or tenderness.        Left: No mass, nipple discharge, skin change or tenderness.  Abdominal:     Palpations: Abdomen is soft.     Tenderness: There is no abdominal tenderness. There is no guarding.  Musculoskeletal:        General: Normal range of motion.     Cervical back: Normal range of motion.  Neurological:     General: No focal deficit present.     Mental Status: She is alert and oriented to person, place, and time.     Cranial Nerves: No cranial nerve deficit.  Skin:    General: Skin is warm and dry.  Psychiatric:        Mood and Affect: Mood normal.        Behavior: Behavior normal.        Thought Content: Thought content normal.         Judgment: Judgment normal.  Vitals reviewed.     Assessment/Plan: Encounter for annual routine gynecological examination  Cervical cancer screening - Plan: Cytology - PAP  Screening for HPV (human papillomavirus) - Plan: Cytology - PAP; hx of cx dysplasia prior to hyst   Encounter for screening mammogram for malignant neoplasm of breast - Plan: MM 3D SCREEN BREAST BILATERAL; pt to sched mammo  Family history of breast cancer--pt to clarify her genetic testing results. May qualify for updated testing.    Menopause - Plan: FSH/LH, Estradiol; check to see status due to sx. Will f/u with results.   Vasomotor symptoms due to [facility] labs. If neg, will discuss tx options.   Blood tests for routine general physical examination - Plan: FSH/LH, Estradiol, Hemoglobin A1c  Screening for diabetes mellitus - Plan: Hemoglobin A1c            GYN counsel breast self exam, mammography screening, menopause, adequate intake of calcium and vitamin D, diet and exercise     F/U  Return in about 1 year (around 12/16/2020).  Eryn Marandola B. Nykeem Citro, PA-C 12/17/2019 2:52 PM

## 2019-12-18 LAB — HEMOGLOBIN A1C
Est. average glucose Bld gHb Est-mCnc: 117 mg/dL
Hgb A1c MFr Bld: 5.7 % — ABNORMAL HIGH (ref 4.8–5.6)

## 2019-12-18 LAB — FSH/LH
FSH: 6.2 m[IU]/mL
LH: 7.3 m[IU]/mL

## 2019-12-18 LAB — ESTRADIOL: Estradiol: 74.5 pg/mL

## 2019-12-22 LAB — CYTOLOGY - PAP
Comment: NEGATIVE
High risk HPV: NEGATIVE

## 2020-01-20 ENCOUNTER — Other Ambulatory Visit: Payer: Self-pay

## 2020-01-20 ENCOUNTER — Ambulatory Visit (INDEPENDENT_AMBULATORY_CARE_PROVIDER_SITE_OTHER): Payer: Medicaid Other | Admitting: Obstetrics and Gynecology

## 2020-01-20 ENCOUNTER — Encounter: Payer: Self-pay | Admitting: Obstetrics and Gynecology

## 2020-01-20 VITALS — BP 126/82 | Wt 187.0 lb

## 2020-01-20 DIAGNOSIS — R87612 Low grade squamous intraepithelial lesion on cytologic smear of cervix (LGSIL): Secondary | ICD-10-CM | POA: Diagnosis not present

## 2020-01-20 NOTE — Progress Notes (Signed)
Obstetrics & Gynecology Office Visit   Chief Complaint:  Chief Complaint  Patient presents with  . Consult    Abnormal PAP    History of Present Illness:Linda Cochran is a 44 y.o. woman who presents today for continued surveillance for history of dysplasia. Last pap obtained on 12/16/2017 revealed LSIL HPV positive.  Prior pap 05/13/2018 NILM HPV negative.  The patient reports history of prior TAH with prior history of cervical dysplasia.    Pap/Treatment History:  05/13/2018 NILM HPV negative 12/17/2019 LSIL HPV positive    Review of Systems: review of systems negative unless otherwise noted in HPI  Past Medical History:  Patient Active Problem List   Diagnosis Date Noted  . Family history of breast cancer 12/17/2019  . Vasomotor symptoms due to menopause 12/17/2019  . 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 Migraines 01/10/2017  . Vertigo 01/10/2017    Past Surgical History:  Patient Active Problem List   Diagnosis Date Noted  . Family history of breast cancer 12/17/2019  . Vasomotor symptoms due to menopause 12/17/2019  . 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 Migraines 01/10/2017  . Vertigo 01/10/2017    Gynecologic History: No LMP recorded. Patient has had a hysterectomy.  Obstetric History: G2P1103  Family History:  Family History  Problem Relation Age of Onset  . Kidney failure Mother   . Hypertension Mother   . Migraines Mother   . Skin cancer Mother   . Cervical cancer Mother   . Diabetes Mother   . Hypertension Father   . Diabetes Father   . Breast cancer Paternal Aunt   . Brain cancer Paternal Aunt   . Uterine cancer Maternal Grandmother   . Kidney failure Maternal Grandmother   . Colon cancer Maternal Grandfather   . Skin cancer Maternal Grandfather   . Diabetes Maternal Grandfather   . Kidney failure Paternal Grandfather     Social History:  Social History   Socioeconomic History  . Marital status: Single    Spouse name: Not on  file  . Number of children: Not on file  . Years of education: Not on file  . Highest education level: Not on file  Occupational History  . Not on file  Tobacco Use  . Smoking status: Never Smoker  . Smokeless tobacco: Never Used  Vaping Use  . Vaping Use: Never used  Substance and Sexual Activity  . Alcohol use: No  . Drug use: No  . Sexual activity: Yes    Birth control/protection: Surgical    Comment: Hysterectomy  Other Topics Concern  . Not on file  Social History Narrative  . Not on file   Social Determinants of Health   Financial Resource Strain:   . Difficulty of Paying Living Expenses: Not on file  Food Insecurity:   . Worried About Charity fundraiser in the Last Year: Not on file  . Ran Out of Food in the Last Year: Not on file  Transportation Needs:   . Lack of Transportation (Medical): Not on file  . Lack of Transportation (Non-Medical): Not on file  Physical Activity:   . Days of Exercise per Week: Not on file  . Minutes of Exercise per Session: Not on file  Stress:   . Feeling of Stress : Not on file  Social Connections:   . Frequency of Communication with Friends and Family: Not on file  . Frequency of Social Gatherings with Friends and Family: Not on file  .  Attends Religious Services: Not on file  . Active Member of Clubs or Organizations: Not on file  . Attends Archivist Meetings: Not on file  . Marital Status: Not on file  Intimate Partner Violence:   . Fear of Current or Ex-Partner: Not on file  . Emotionally Abused: Not on file  . Physically Abused: Not on file  . Sexually Abused: Not on file    Allergies:  Allergies  Allergen Reactions  . Methylprednisolone Other (See Comments)    Paralytic reaction  . Codeine   . Dilaudid [Hydromorphone Hcl]   . Penicillins   . Pantoprazole Sodium Diarrhea and Nausea Only    Stomach cramps    Medications: Prior to Admission medications   Medication Sig Start Date End Date Taking?  Authorizing Provider  amitriptyline (ELAVIL) 50 MG tablet TAKE 1 TABLET BY MOUTH EVERYDAY AT BEDTIME 07/16/18  Yes Vanga, Tally Due, MD  cyclobenzaprine (FLEXERIL) 10 MG tablet TAKE 1 TABLET BY MOUTH 3 TIMES A DAY AS NEEDED FOR MUSCLE SPASMS FOR UP TO 10 DAYS. 04/10/18  Yes [provider]  diazepam (VALIUM) 2 MG tablet TAKE 1 TABLET BY MOUTH EVERY 12 HOURS AS NEEDED FOR VERTIGO 05/10/18  Yes [provider]  eletriptan (RELPAX) 40 MG tablet TAKE 1 TABLET BY MOUTH AS NEEDED. MAY REPEAT IN 2 HOURS IF NECESSARY. NO MORE THAN 2 IN 24 HOURS. 11/03/19  Yes [provider]  meclizine (ANTIVERT) 12.5 MG tablet Take by mouth.   Yes [provider]  montelukast (SINGULAIR) 10 MG tablet Take by mouth. 09/08/19  Yes [provider]  omeprazole (PRILOSEC OTC) 20 MG tablet Take by mouth.   Yes [provider]  ondansetron (ZOFRAN-ODT) 8 MG disintegrating tablet Take 8 mg by mouth every 8 (eight) hours as needed. for nausea 03/20/18  Yes [provider]  promethazine (PHENERGAN) 25 MG suppository Place rectally. 11/12/19  Yes [provider]  triamcinolone (NASACORT) 55 MCG/ACT AERO nasal inhaler two sprays by Both Nostrils route daily. 09/08/19 09/07/20 Yes [provider]    Physical Exam Vitals:  Vitals:   01/20/20 1502  BP: 126/82   No LMP recorded. Patient has had a hysterectomy.  General: NAD HEENT: normocephalic, anicteric Pulmonary: No increased work of breathing Extremities: no edema, erythema, or tenderness Neurologic: Grossly intact Psychiatric: mood appropriate, affect full  Female chaperone present for pelvic and breast  portions of the physical exam  Assessment: 44 y.o. [id number] follow up for LGSIL pap and HPV negative pap  Plan: Problem List Items Addressed This Visit    None    Visit Diagnoses    LGSIL on Pap smear of cervix    -  Primary      - Follow up pap smear 12 months.     - I had a lengthly  discussion with Linda Cochran  regarding the cause of dysplasia of the lower genital tract (including immunosuppression in the setting of HPV exposure and tobacco exposure). I explained the potential for progression to invasive malignancy, the recurrent nature of these lesions (and the need for close continued followup). Results of today's pap will dictate need for further evaluation and follow up per ASCCP guidelines..  - She is comfortable with the plan and had her questions answered.  - Return in about 1 year (around 01/19/2021) for annual (release of information pathoogy reports [place]) .   Malachy Mood, MD, [facility], [place]

## 2020-01-20 NOTE — Patient Instructions (Addendum)
Women with a history of CIN2 or amore severe diagnosis shouldcontinue routine screening for at least 20 years.   American Cancer Society, American Society for Colposcopy and Cervical Pathology, and American Society for Clinical Pathology Screening Guidelines for the Prevention and Early Detection of Cervical Cancer 2012 Consensus Statement  

## 2020-05-13 ENCOUNTER — Ambulatory Visit (INDEPENDENT_AMBULATORY_CARE_PROVIDER_SITE_OTHER): Payer: Medicaid Other | Admitting: Obstetrics and Gynecology

## 2020-05-13 ENCOUNTER — Other Ambulatory Visit: Payer: Self-pay

## 2020-05-13 ENCOUNTER — Encounter: Payer: Self-pay | Admitting: Obstetrics and Gynecology

## 2020-05-13 VITALS — BP 121/79 | Ht 65.5 in | Wt 191.0 lb

## 2020-05-13 DIAGNOSIS — N764 Abscess of vulva: Secondary | ICD-10-CM | POA: Diagnosis not present

## 2020-05-13 MED ORDER — DOXYCYCLINE HYCLATE 100 MG PO CAPS: 100.0000 mg | ORAL_CAPSULE | Freq: Two times a day (BID) | ORAL | 0 refills | Status: AC

## 2020-05-13 NOTE — Progress Notes (Signed)
Obstetrics & Gynecology Office Visit   Chief Complaint:  Chief Complaint  Patient presents with  . Vaginal Pain    Sore in vaginal area x 1 wk    History of Present Illness: 44 y.o. female presenting with one weak of labial swelling.  No trauma or inciting event such as exposure to any new potential allergens. No previous episodes.  Initially started out as small area right labia before becoming larger and more tender over the weekend.  The area spontaneously drained Sunday night and had gone down significantly in size and tenderness by Monday morning.  However still feels some firmness and tenderness in the area which she feels has slowly worsened over the past few days.    Past medical history is negative for DMII, the patient is not immunosuppressed.     Review of Systems: Review of Systems  Constitutional: Negative.   Genitourinary: Negative.   Skin: Negative for itching and rash.    Past Medical History:  Past Medical History:  Diagnosis Date  . Acid reflux   . Chickenpox   . Depression   . IBS (irritable bowel syndrome)   . Migraine aura, persistent   . Papilloma    anus  . Vitamin D deficiency     Past Surgical History:  Past Surgical History:  Procedure Laterality Date  . ABDOMINAL HYSTERECTOMY    . CESAREAN SECTION  2005  . CHOLECYSTECTOMY  2007  . COLONOSCOPY  2019  . CYST REMOVAL NECK  1995  . laproscopy  2005  . uterine prolapse repair      Gynecologic History: No LMP recorded. Patient has had a hysterectomy.  Obstetric History: G2P1103  Family History:  Family History  Problem Relation Age of Onset  . Kidney failure Mother   . Hypertension Mother   . Migraines Mother   . Skin cancer Mother   . Cervical cancer Mother   . Diabetes Mother   . Hypertension Father   . Diabetes Father   . Breast cancer Paternal Aunt   . Brain cancer Paternal Aunt   . Uterine cancer Maternal Grandmother   . Kidney failure Maternal Grandmother   . Colon  cancer Maternal Grandfather   . Skin cancer Maternal Grandfather   . Diabetes Maternal Grandfather   . Kidney failure Paternal Grandfather     Social History:  Social History   Socioeconomic History  . Marital status: Single    Spouse name: Not on file  . Number of children: Not on file  . Years of education: Not on file  . Highest education level: Not on file  Occupational History  . Not on file  Tobacco Use  . Smoking status: Never Smoker  . Smokeless tobacco: Never Used  Vaping Use  . Vaping Use: Never used  Substance and Sexual Activity  . Alcohol use: No  . Drug use: No  . Sexual activity: Yes    Birth control/protection: Surgical    Comment: Hysterectomy  Other Topics Concern  . Not on file  Social History Narrative  . Not on file   Social Determinants of Health   Financial Resource Strain:   . Difficulty of Paying Living Expenses: Not on file  Food Insecurity:   . Worried About Charity fundraiser in the Last Year: Not on file  . Ran Out of Food in the Last Year: Not on file  Transportation Needs:   . Lack of Transportation (Medical): Not on file  .  Lack of Transportation (Non-Medical): Not on file  Physical Activity:   . Days of Exercise per Week: Not on file  . Minutes of Exercise per Session: Not on file  Stress:   . Feeling of Stress : Not on file  Social Connections:   . Frequency of Communication with Friends and Family: Not on file  . Frequency of Social Gatherings with Friends and Family: Not on file  . Attends Religious Services: Not on file  . Active Member of Clubs or Organizations: Not on file  . Attends Archivist Meetings: Not on file  . Marital Status: Not on file  Intimate Partner Violence:   . Fear of Current or Ex-Partner: Not on file  . Emotionally Abused: Not on file  . Physically Abused: Not on file  . Sexually Abused: Not on file    Allergies:  Allergies  Allergen Reactions  . Methylprednisolone Other (See  Comments)    Paralytic reaction  . Codeine   . Dilaudid [Hydromorphone Hcl]   . Penicillins   . Pantoprazole Sodium Diarrhea and Nausea Only    Stomach cramps    Medications: Prior to Admission medications   Medication Sig Start Date End Date Taking? Authorizing Provider  amitriptyline (ELAVIL) 50 MG tablet TAKE 1 TABLET BY MOUTH EVERYDAY AT BEDTIME 07/16/18  Yes Vanga, Tally Due, MD  cyclobenzaprine (FLEXERIL) 10 MG tablet TAKE 1 TABLET BY MOUTH 3 TIMES A DAY AS NEEDED FOR MUSCLE SPASMS FOR UP TO 10 DAYS. 04/10/18  Yes [provider]  eletriptan (RELPAX) 40 MG tablet TAKE 1 TABLET BY MOUTH AS NEEDED. MAY REPEAT IN 2 HOURS IF NECESSARY. NO MORE THAN 2 IN 24 HOURS. 11/03/19  Yes [provider]  meclizine (ANTIVERT) 12.5 MG tablet Take by mouth.   Yes [provider]  montelukast (SINGULAIR) 10 MG tablet Take by mouth. 09/08/19  Yes [provider]  omeprazole (PRILOSEC OTC) 20 MG tablet Take by mouth.   Yes [provider]  ondansetron (ZOFRAN-ODT) 8 MG disintegrating tablet Take 8 mg by mouth every 8 (eight) hours as needed. for nausea 03/20/18  Yes [provider]  diazepam (VALIUM) 2 MG tablet TAKE 1 TABLET BY MOUTH EVERY 12 HOURS AS NEEDED FOR VERTIGO Patient not taking: Reported on 05/13/2020 05/10/18   [provider]  promethazine (PHENERGAN) 25 MG suppository Place rectally. Patient not taking: Reported on 05/13/2020 11/12/19   [provider]  triamcinolone (NASACORT) 55 MCG/ACT AERO nasal inhaler two sprays by Both Nostrils route daily. Patient not taking: Reported on 05/13/2020 09/08/19 09/07/20  [provider]    Physical Exam Vitals:  Vitals:   05/13/20 0842  BP: 121/79   No LMP recorded. Patient has had a hysterectomy.  General: NAD, well nourished, appears stated age 30: normocephalic, anicteric Pulmonary: No increased work of breathing Genitourinary:  External: The right labia is  slightly more prominent than the left labia majora.  There is minimal induration, no appreciable fluculance or mass.  No overlying skin changes.  Normal urethral meatus, normal Bartholin's and Skene's glands.    Rectal: deferred Extremities: no edema, erythema, or tenderness Neurologic: Grossly intact Psychiatric: mood appropriate, affect full  Female chaperone present for pelvic  portions of the physical exam  Assessment: 44 y.o. [id number] with labial abscess   Plan: Problem List Items Addressed This Visit    None    Visit Diagnoses    Labial abscess    -  Primary     1)  Right labial abscess  - spontanously drained 4 days ago - may use sitz baths prn - given some continued induration and discomfort 10 day course doxycyline to prevent relapse - will re-evaluate in 1 week to verify no fluid collection amenable to drainage develops  2) Return in about 1 week (around 05/20/2020).    Malachy Mood, MD, [facility], [facility] 05/13/2020, 8:33 PM

## 2020-05-21 ENCOUNTER — Ambulatory Visit: Payer: Medicaid Other | Admitting: Obstetrics and Gynecology

## 2020-05-26 ENCOUNTER — Other Ambulatory Visit: Payer: Self-pay

## 2020-05-26 ENCOUNTER — Ambulatory Visit (INDEPENDENT_AMBULATORY_CARE_PROVIDER_SITE_OTHER): Payer: Medicaid Other | Admitting: Internal Medicine

## 2020-05-26 ENCOUNTER — Encounter: Payer: Self-pay | Admitting: Internal Medicine

## 2020-05-26 DIAGNOSIS — J45991 Cough variant asthma: Secondary | ICD-10-CM | POA: Diagnosis not present

## 2020-05-26 MED ORDER — OMEPRAZOLE 40 MG PO CPDR: 40.0000 mg | DELAYED_RELEASE_CAPSULE | Freq: Every day | ORAL | 11 refills | Status: DC

## 2020-05-26 MED ORDER — FAMOTIDINE 20 MG PO TABS: ORAL_TABLET | ORAL | 11 refills | Status: DC

## 2020-05-26 NOTE — Patient Instructions (Addendum)
Prilosec 40  Take  30-60 min before first meal of the day and Pepcid (famotidine)  20 mg one after supper  until return to office - this is the best way to tell whether stomach acid is contributing to your problem.    GERD (REFLUX)  is an extremely common cause of respiratory symptoms just like yours , many times with no obvious heartburn at all.    It can be treated with medication, but also with lifestyle changes including elevation of the head of your bed (ideally with 6-8inch blocks under the headboard of your bed),  Smoking cessation, avoidance of late meals, excessive alcohol, and avoid fatty foods, chocolate, peppermint, colas, red wine, and acidic juices such as orange juice.  NO MINT OR MENTHOL PRODUCTS SO NO COUGH DROPS  USE SUGARLESS CANDY INSTEAD (Jolley ranchers or Stover's or Life Savers) or even ice chips will also do - the key is to swallow to prevent all throat clearing. NO OIL BASED VITAMINS - use powdered substitutes.  Avoid fish oil when coughing.   Continue singulair every evening   For drainage / throat tickle try take CHLORPHENIRAMINE  4 mg  (Chlortab 4mg   at [employer] should be easiest to find in the green box)  take one every 4 hours as needed - available over the counter- may cause drowsiness so start with just a bedtime dose or two and see how you tolerate it before trying in daytime    Only use your albuterol as a rescue medication to be used if you can't catch your breath by resting or doing a relaxed purse lip breathing pattern.  - The less you use it, the better it will work when you need it. - Ok to use up to 2 puffs  every 4 hours if you must but call for immediate appointment if use goes up over your usual need - Don't leave home without it !!  (think of it like the spare tire for your car)   Please schedule a follow up office visit in 6 weeks, call sooner if needed

## 2020-05-26 NOTE — Assessment & Plan Note (Addendum)
Onset of allergies as child mostly rhinitis/ min cough sob  - rhinitis  improved on singlair spring 2021 but not the cough nor did it respond to prednisone - max gerd rx advised 05/26/2020   DDX of  difficult airways management almost all start with A and  include Adherence, Ace Inhibitors, Acid Reflux, Active Sinus Disease, Alpha 1 Antitripsin deficiency, Anxiety masquerading as Airways dz,  ABPA,  Allergy(esp in young), Aspiration (esp in elderly), Adverse effects of meds,  Active smoking or vaping, A bunch of PE's (a small clot burden can't cause this syndrome unless there is already severe underlying pulm or vascular dz with poor reserve) plus two Bs  = Bronchiectasis and Beta blocker use..and one C= CHF  Adherence is always the initial "prime suspect" and is a multilayered concern that requires a "trust but verify" approach in every patient - starting with knowing how to use medications, especially inhalers, correctly, keeping up with refills and understanding the fundamental difference between maintenance and prns vs those medications only taken for a very short course and then stopped and not refilled.  - reviewed approp use of hfa   ? Allergy > note response to singulair which she should continue for now  but no additional benefit to prednisone more typical of Upper airway cough syndrome (previously labeled PNDS),  is so named because it's frequently impossible to sort out how much is  CR/sinusitis with freq throat clearing (which can be related to primary GERD)   vs  causing  secondary (" extra esophageal")  GERD from wide swings in gastric pressure that occur with throat clearing, often  promoting self use of mint and menthol lozenges that reduce the lower esophageal sphincter tone and exacerbate the problem further in a cyclical fashion.   These are the same pts (now being labeled as having "irritable larynx syndrome" by some cough centers) who not infrequently have a history of having failed  to tolerate ace inhibitors,  dry powder inhalers or biphosphonates or report having atypical/extraesophageal reflux symptoms that don't respond to standard doses of PPI  and are easily confused as having aecopd or asthma flares by even experienced allergists/ pulmonologists (myself included).   ? Acid (or non-acid) GERD > always difficult to exclude as up to 75% of pts in some series report no assoc GI/ Heartburn symptoms> rec max (24h)  acid suppression and diet restrictions/ reviewed and instructions given in writing.   ? Anxiety/depression/decondtioning   > usually at the bottom of this list of usual suspects but may interfere with adherence and also interpretation of response or lack thereof to symptom management which can be quite subjective.    >>> f/u 6 weeks with allergy profile then          Each maintenance medication was reviewed in detail including emphasizing most importantly the difference between maintenance and prns and under what circumstances the prns are to be triggered using an action plan format where appropriate.  Total time for H and P, chart review, counseling, teaching device and generating customized AVS unique to this office visit / charting =  32 min

## 2020-05-26 NOTE — Progress Notes (Signed)
Linda Cochran, female    DOB: 04-14-1976, 44 y.o.   MRN: [id number]   Brief patient profile:  65 yowf never smoker "always had allergies" going back as far as she can remember and symptoms rhinitis/ itching / sneezing spring and cat never had allergy testing and symptoms have worsened over the years and worst in spring of 2021 taking benadryl by the handful with breathing problems and better on singulair and needed less saba / no benadryl and no change on prednisone for back finished 05/23/20 with wt gain "will never take it again" so referred to pulmonary clinic in [place]  05/26/2020 by Dr Rebecca Eaton      History of Present Illness  05/26/2020  Pulmonary/ 1st office eval/ Koray Soter / [place] [facility]  Baseline wt 187 on singulair and prn saba  Chief Complaint  Patient presents with  . Pulmonary Consult    Referred by Dr. Catarina Hartshorn. Pt c/o SOB over the past year, worse the past 4-6 months. She states she gets SOB sometimes at rest and also with exertion such as walking room to room at home.   Dyspnea: swimming at gym due to knee pain / feels not making progress but not losing  ground  Cough: clearing throat non stop daytime/ urinary incont  Sleep: occ wakes up with throat symptoms/ on side / wedge pillow  SABA use: alb q 3d  Sporadic  Already on prilosec 20mg  x one year for overt hb  (protonix caused bad cramps) Has had spells of cough where can't breathe or speak   No obvious day to day or daytime variability or assoc excess/ purulent sputum or mucus plugs or hemoptysis or cp or chest tightness, subjective wheeze or overt sinus or hb symptoms.     Also denies any obvious fluctuation of symptoms with weather or environmental changes or other aggravating or alleviating factors except as outlined above   No unusual exposure hx or h/o childhood pna  or knowledge of premature birth.  Current Allergies, Complete Past Medical History, Past Surgical History, Family History, and Social History  were reviewed in [employer] record.  ROS  The following are not active complaints unless bolded Hoarseness, sore throat, dysphagia= globus sensation , dental problems, itching, sneezing,  nasal congestion or discharge of excess mucus or purulent secretions, ear ache,   fever, chills, sweats, unintended wt loss or wt gain, classically pleuritic or exertional cp,  orthopnea pnd or arm/hand swelling  or leg swelling, presyncope, palpitations, abdominal pain, anorexia, nausea, vomiting, diarrhea  or change in bowel habits or change in bladder habits, change in stools or change in urine, dysuria, hematuria,  rash, arthralgias, visual complaints, headache, numbness, weakness or ataxia or problems with walking or coordination,  change in mood or  memory.            Past Medical History:  Diagnosis Date  . Acid reflux   . Chickenpox   . Depression   . IBS (irritable bowel syndrome)   . Migraine aura, persistent   . Papilloma    anus  . Vitamin D deficiency     Outpatient Medications Prior to Visit  Medication Sig Dispense Refill  . amitriptyline (ELAVIL) 50 MG tablet TAKE 1 TABLET BY MOUTH EVERYDAY AT BEDTIME 90 tablet 0  . cyclobenzaprine (FLEXERIL) 10 MG tablet TAKE 1 TABLET BY MOUTH 3 TIMES A DAY AS NEEDED FOR MUSCLE SPASMS FOR UP TO 10 DAYS.  1  . eletriptan (RELPAX) 40 MG tablet TAKE  1 TABLET BY MOUTH AS NEEDED. MAY REPEAT IN 2 HOURS IF NECESSARY. NO MORE THAN 2 IN 24 HOURS.    . meclizine (ANTIVERT) 12.5 MG tablet Take by mouth.    . montelukast (SINGULAIR) 10 MG tablet Take by mouth.    Marland Kitchen omeprazole (PRILOSEC OTC) 20 MG tablet Take by mouth.    . ondansetron (ZOFRAN-ODT) 8 MG disintegrating tablet Take 8 mg by mouth every 8 (eight) hours as needed. for nausea  3  . diazepam (VALIUM) 2 MG tablet TAKE 1 TABLET BY MOUTH EVERY 12 HOURS AS NEEDED FOR VERTIGO (Patient not taking: Reported on 05/13/2020)    . promethazine (PHENERGAN) 25 MG suppository Place rectally.  (Patient not taking: Reported on 05/13/2020)    . triamcinolone (NASACORT) 55 MCG/ACT AERO nasal inhaler two sprays by Both Nostrils route daily. (Patient not taking: No sig reported)     No facility-administered medications prior to visit.     Objective:     BP 126/78 (BP Location: Left Arm, Cuff Size: Normal)   Pulse 94   Temp (!) 97.1 F (36.2 C) (Temporal)   Ht 5' 5.5" (1.664 m)   Wt 194 lb (88 kg)   SpO2 98% Comment: on RA  BMI 31.79 kg/m   SpO2: 98 % (on RA)   amb wf nad freq throat clearing    HEENT : pt wearing mask not removed for exam due to covid -19 concerns.    NECK :  without JVD/Nodes/TM/ nl carotid upstrokes bilaterally   LUNGS: no acc muscle use,  Nl contour chest which is clear to A and P bilaterally without cough on insp or exp maneuvers   CV:  RRR  no s3 or murmur or increase in P2, and no edema   ABD: obese  soft and nontender with nl inspiratory excursion in the supine position. No bruits or organomegaly appreciated, bowel sounds nl  MS:  Nl gait/ ext warm without deformities, calf tenderness, cyanosis or clubbing No obvious joint restrictions   SKIN: warm and dry without lesions    NEURO:  alert, approp, nl sensorium with  no motor or cerebellar deficits apparent.       Assessment   Cough variant asthma vs UACS Onset of allergies as child mostly rhinitis/ min cough sob  - rhinitis  improved on singlair spring 2021 but not the cough nor did it respond to prednisone - max gerd rx advised 05/26/2020   DDX of  difficult airways management almost all start with A and  include Adherence, Ace Inhibitors, Acid Reflux, Active Sinus Disease, Alpha 1 Antitripsin deficiency, Anxiety masquerading as Airways dz,  ABPA,  Allergy(esp in young), Aspiration (esp in elderly), Adverse effects of meds,  Active smoking or vaping, A bunch of PE's (a small clot burden can't cause this syndrome unless there is already severe underlying pulm or vascular dz with poor  reserve) plus two Bs  = Bronchiectasis and Beta blocker use..and one C= CHF  Adherence is always the initial "prime suspect" and is a multilayered concern that requires a "trust but verify" approach in every patient - starting with knowing how to use medications, especially inhalers, correctly, keeping up with refills and understanding the fundamental difference between maintenance and prns vs those medications only taken for a very short course and then stopped and not refilled.  - reviewed approp use of hfa   ? Allergy > note response to singulair which she should continue for now  but no additional benefit to  prednisone more typical of Upper airway cough syndrome (previously labeled PNDS),  is so named because it's frequently impossible to sort out how much is  CR/sinusitis with freq throat clearing (which can be related to primary GERD)   vs  causing  secondary (" extra esophageal")  GERD from wide swings in gastric pressure that occur with throat clearing, often  promoting self use of mint and menthol lozenges that reduce the lower esophageal sphincter tone and exacerbate the problem further in a cyclical fashion.   These are the same pts (now being labeled as having "irritable larynx syndrome" by some cough centers) who not infrequently have a history of having failed to tolerate ace inhibitors,  dry powder inhalers or biphosphonates or report having atypical/extraesophageal reflux symptoms that don't respond to standard doses of PPI  and are easily confused as having aecopd or asthma flares by even experienced allergists/ pulmonologists (myself included).   ? Acid (or non-acid) GERD > always difficult to exclude as up to 75% of pts in some series report no assoc GI/ Heartburn symptoms> rec max (24h)  acid suppression and diet restrictions/ reviewed and instructions given in writing.   ? Anxiety/depression/decondtioning   > usually at the bottom of this list of usual suspects but may interfere with  adherence and also interpretation of response or lack thereof to symptom management which can be quite subjective.    >>> f/u 6 weeks with allergy profile then          Each maintenance medication was reviewed in detail including emphasizing most importantly the difference between maintenance and prns and under what circumstances the prns are to be triggered using an action plan format where appropriate.  Total time for H and P, chart review, counseling, teaching device and generating customized AVS unique to this office visit / charting =  32 min           Christinia Gully, MD 05/26/2020

## 2020-05-29 DIAGNOSIS — S63592A Other specified sprain of left wrist, initial encounter: Secondary | ICD-10-CM | POA: Insufficient documentation

## 2020-05-29 DIAGNOSIS — G5602 Carpal tunnel syndrome, left upper limb: Secondary | ICD-10-CM | POA: Insufficient documentation

## 2020-07-06 ENCOUNTER — Ambulatory Visit: Payer: Medicaid Other | Admitting: Internal Medicine

## 2020-07-19 ENCOUNTER — Other Ambulatory Visit: Payer: Self-pay

## 2020-07-19 ENCOUNTER — Encounter: Payer: Self-pay | Admitting: Internal Medicine

## 2020-07-19 ENCOUNTER — Ambulatory Visit (INDEPENDENT_AMBULATORY_CARE_PROVIDER_SITE_OTHER): Payer: Medicaid Other | Admitting: Internal Medicine

## 2020-07-19 DIAGNOSIS — J45991 Cough variant asthma: Secondary | ICD-10-CM

## 2020-07-19 NOTE — Assessment & Plan Note (Signed)
Onset of allergies as child mostly rhinitis/ min cough sob  - rhinitis  improved on singlair spring 2021 but not the cough nor did it respond to prednisone - max gerd rx advised 05/26/2020  > cough resolved 07/19/2020 on gerd rx plus singulair    Clinical course does not suggest asthma but rather Upper airway cough syndrome (previously labeled PNDS),  is so named because it's frequently impossible to sort out how much is  CR/sinusitis with freq throat clearing (which can be related to primary GERD)   vs  causing  secondary (" extra esophageal")  GERD from wide swings in gastric pressure that occur with throat clearing, often  promoting self use of mint and menthol lozenges that reduce the lower esophageal sphincter tone and exacerbate the problem further in a cyclical fashion.   These are the same pts (now being labeled as having "irritable larynx syndrome" by some cough centers) who not infrequently have a history of having failed to tolerate ace inhibitors,  dry powder inhalers or biphosphonates or report having atypical/extraesophageal reflux symptoms that don't respond to standard doses of PPI  and are easily confused as having aecopd or asthma flares by even experienced allergists/ pulmonologists (myself included).   Advised f/u in 3 m, consider allergy profile if flares in spring despite rx for gerd  In meantime for nasal symptoms including pnds rec 1st gen H1 blockers per guidelines            Each maintenance medication was reviewed in detail including emphasizing most importantly the difference between maintenance and prns and under what circumstances the prns are to be triggered using an action plan format where appropriate.  Total time for H and P, chart review, counseling, reviewing  and generating customized AVS unique to this office visit / same day charting =25 min

## 2020-07-19 NOTE — Patient Instructions (Addendum)
For drainage / throat tickle try take CHLORPHENIRAMINE  4 mg  (Chlortab 4mg )  at [employer] should be easiest to find in the green box)  take one every 4 hours as needed - available over the counter- may cause drowsiness so start with just a bedtime dose or two and see how you tolerate it before trying in daytime      Please schedule a follow up visit in 3 months but call sooner if needed

## 2020-07-19 NOTE — Progress Notes (Signed)
Nubia Ziesmer, female    DOB: 1976-04-05    MRN: [id number]   Brief patient profile:  21 yowf never smoker "always had allergies" going back as far as she can remember and symptoms rhinitis/ itching / sneezing spring and cat exo  never had allergy testing and symptoms have worsened over the years esp since spring of 2021 taking benadryl by the handful with breathing problems and better on singulair and needed less saba / no benadryl and no change on prednisone for back finished 05/23/20 with wt gain "will never take it again" so referred to pulmonary clinic in [facility]  05/26/2020 by Dr Rebecca Eaton.     History of Present Illness  05/26/2020  Pulmonary/ 1st office eval/ Jerelene Salaam / [place] [facility]  Baseline wt 187 on singulair and prn saba  Chief Complaint  Patient presents with  . Pulmonary Consult    Referred by Dr. Catarina Hartshorn. Pt c/o SOB over the past year, worse the past 4-6 months. She states she gets SOB sometimes at rest and also with exertion such as walking room to room at home.   Dyspnea: swimming at gym due to knee pain / feels not making progress but not losing  ground  Cough: clearing throat non stop daytime/ urinary incont  Sleep: occ wakes up with throat symptoms/ on side / wedge pillow  SABA use: alb q 3d  Sporadic  Already on prilosec 20mg  x one year for overt hb  (protonix caused bad cramps) Has had spells of cough where can't breathe or speak  rec Prilosec 40  Take  30-60 min before first meal of the day and Pepcid (famotidine)  20 mg one after supper  until return to office - this is the best way to tell whether stomach acid is contributing to your problem.   GERD diet  Continue singulair every evening  For drainage / throat tickle try take CHLORPHENIRAMINE  4 mg  (Chlortab 4mg )  Only use your albuterol as a rescue medication Please schedule a follow up office visit in 6 weeks, call sooner if needed    07/19/2020  f/u ov/[place] office/Natacia Chaisson re: uacs / no longer on any  inhalers  Chief Complaint  Patient presents with  . Follow-up    Shortness of breath with activity (only sometimes)   Dyspnea: house cleaning/ limited by knee L > R Cough: much better x after dinner Sleeping: hob is up on blocks, 2 pillows 1-2 x per week wakes up coughing  SABA use: none needed  02: none Covid status: vax/max      No obvious day to day or daytime variability or assoc excess/ purulent sputum or mucus plugs or hemoptysis or cp or chest tightness, subjective wheeze or overt sinus or hb symptoms.    Also denies any obvious fluctuation of symptoms with weather or environmental changes or other aggravating or alleviating factors except as outlined above   No unusual exposure hx or h/o childhood pna/ asthma or knowledge of premature birth.  Current Allergies, Complete Past Medical History, Past Surgical History, Family History, and Social History were reviewed in [employer] record.  ROS  The following are not active complaints unless bolded Hoarseness, sore throat, dysphagia, dental problems, itching, sneezing,  nasal congestion or discharge of excess mucus or purulent secretions, ear ache,   fever, chills, sweats, unintended wt loss or wt gain, classically pleuritic or exertional cp,  orthopnea pnd or arm/hand swelling  or leg swelling, presyncope, palpitations, abdominal pain, anorexia,  nausea, vomiting, diarrhea  or change in bowel habits or change in bladder habits, change in stools or change in urine, dysuria, hematuria,  rash, arthralgias, visual complaints, headache, numbness, weakness or ataxia or problems with walking or coordination,  change in mood or  memory.        Current Meds  Medication Sig  . amitriptyline (ELAVIL) 50 MG tablet TAKE 1 TABLET BY MOUTH EVERYDAY AT BEDTIME  . cyclobenzaprine (FLEXERIL) 10 MG tablet Take 10 mg by mouth in the morning and at bedtime.  Marland Kitchen eletriptan (RELPAX) 40 MG tablet TAKE 1 TABLET BY MOUTH AS NEEDED. MAY  REPEAT IN 2 HOURS IF NECESSARY. NO MORE THAN 2 IN 24 HOURS.  . famotidine (PEPCID) 20 MG tablet One after supper  . meclizine (ANTIVERT) 25 MG tablet Take 25 mg by mouth 3 (three) times daily as needed for dizziness.  . montelukast (SINGULAIR) 10 MG tablet Take by mouth.  Marland Kitchen omeprazole (PRILOSEC) 40 MG capsule Take 1 capsule (40 mg total) by mouth daily.  . ondansetron (ZOFRAN-ODT) 8 MG disintegrating tablet Take 8 mg by mouth every 8 (eight) hours as needed. for nausea  . [DISCONTINUED] meclizine (ANTIVERT) 12.5 MG tablet Take by mouth.                Past Medical History:  Diagnosis Date  . Acid reflux   . Chickenpox   . Depression   . IBS (irritable bowel syndrome)   . Migraine aura, persistent   . Papilloma    anus  . Vitamin D deficiency         Objective:       Wt Readings from Last 3 Encounters:  07/19/20 193 lb 6.4 oz (87.7 kg)  05/26/20 194 lb (88 kg)  05/13/20 191 lb (86.6 kg)      Vital signs reviewed  07/19/2020  - Note at rest 02 sats  99% on RA   General appearance:    Pleasant obese amb wf     HEENT : pt wearing mask not removed for exam due to covid -19 concerns.    NECK :  without JVD/Nodes/TM/ nl carotid upstrokes bilaterally   LUNGS: no acc muscle use,  Nl contour chest which is clear to A and P bilaterally without cough on insp or exp maneuvers   CV:  RRR  no s3 or murmur or increase in P2, and no edema   ABD:  soft and nontender with nl inspiratory excursion in the supine position. No bruits or organomegaly appreciated, bowel sounds nl  MS:  Nl gait/ ext warm without deformities, calf tenderness, cyanosis or clubbing No obvious joint restrictions   SKIN: warm and dry without lesions    NEURO:  alert, approp, nl sensorium with  no motor or cerebellar deficits apparent.              Assessment

## 2020-07-21 ENCOUNTER — Ambulatory Visit (INDEPENDENT_AMBULATORY_CARE_PROVIDER_SITE_OTHER): Payer: Medicaid Other | Admitting: Gastroenterology

## 2020-07-21 ENCOUNTER — Other Ambulatory Visit: Payer: Self-pay

## 2020-07-21 ENCOUNTER — Encounter: Payer: Self-pay | Admitting: Gastroenterology

## 2020-07-21 VITALS — BP 125/83 | HR 103 | Temp 97.8°F | Ht 65.5 in | Wt 194.2 lb

## 2020-07-21 DIAGNOSIS — R197 Diarrhea, unspecified: Secondary | ICD-10-CM

## 2020-07-21 DIAGNOSIS — K580 Irritable bowel syndrome with diarrhea: Secondary | ICD-10-CM | POA: Diagnosis not present

## 2020-07-21 DIAGNOSIS — R140 Abdominal distension (gaseous): Secondary | ICD-10-CM

## 2020-07-21 DIAGNOSIS — R1013 Epigastric pain: Secondary | ICD-10-CM

## 2020-07-21 NOTE — Progress Notes (Signed)
Cephas Darby, MD [address]  [place]  [place], [place] [address]  Main: [phone]  Fax: [phone]    Gastroenterology Consultation  Referring Provider:     Quintin Alto, MD Primary Care Physician:  Nicholes Rough, PA-C Primary Gastroenterologist:  Dr. Cephas Darby Reason for Consultation:     IBS flare up        HPI:   Linda Cochran is a 45 y.o. caucasian female referred by Dr. Nicholes Rough, PA-C  for consultation & management of IBS flare up Abdominal pain, cramps, generalized, post prandial urgency past 24months,worse past 3weeks. Poor po intake, nausea, lightheaded. Diagnosed with IBS since she was young, alternating constipation and diarrhea, was using miralax when constipated. She tried keto diet before Thanksgiving as she gained upto 207lbs, her IBS symptoms got worse, quit keto diet after 3 weeks, but symptoms only partially relieved. She tried bentyl 10mg , developed constipation. Her symptoms were manageable prior to going on keto diet. She switched from amitriptyline to propranolol 3weeks ago as her migraines were not under control. She was put on amitriptyline by her neurologist about 3 years ago, had been taking 50mg , couldn't increase the dose due to mood swings and insomnia  Follow-up visit 07/21/2020 Patient has not seen me for almost 2 years.  She reports that she had a recent flareup of her GI symptoms including significant abdominal bloating associated with lower abdominal cramps as well as nonbloody diarrhea.  Patient reports that last 5 to 6 months have been very stressful with home isolation secondary to Covid.  She reports that she was also diagnosed with H. pylori infection based on H. pylori breath test in October when she went to urgent care.  She was treated with antibiotics.  She feels significantly better, her symptoms recurred 3 weeks after finishing the antibiotics.  She was doing well on amitriptyline 25 mg daily.  She developed constipation and 50  mg, therefore decreased the dose to 25 mg.  She is currently taking omeprazole 40 mg once a day along with Pepcid at bedtime after seeing a pulmonologist who mentioned that her allergies and cough are likely secondary to acid reflux.  She lost weight by following healthy diet and exercise.  However, during holidays in December, she again picked up her weight.  She likes to have cheese and ice cream on a regular basis.  Her most recent labs in November revealed normal CBC, TSH, CMP, B12 levels  195lbs on 06/24/18, 186.6 lbs in 2020, 194 LBS today Had traumatic vagina delivery with her twin pregnancy Had uterine prolapse, underwent hysterectomy  She was seeing GI in [place], EGD in 2018, gastric polyp was removed, started on omeprazole for heart burn  Had colonoscopy in 2018 at [facility], removal of anal papilla surgically, post op thrombosed external hemorrhoid  NSAIDs: none  Antiplts/Anticoagulants/Anti thrombotics: none  GI Procedures: EGD in [place] in 2018 Colonoscopy in 2018 at [facility] removed surgically  Past Medical History:  Diagnosis Date  . Acid reflux   . Chickenpox   . Depression   . IBS (irritable bowel syndrome)   . Migraine aura, persistent   . Papilloma    anus  . Vitamin D deficiency     Past Surgical History:  Procedure Laterality Date  . ABDOMINAL HYSTERECTOMY    . CESAREAN SECTION  2005  . CHOLECYSTECTOMY  2007  . COLONOSCOPY  2019  . CYST REMOVAL NECK  1995  . laproscopy  2005  .  uterine prolapse repair      Current Outpatient Medications:  .  albuterol (VENTOLIN HFA) 108 (90 Base) MCG/ACT inhaler, Inhale into the lungs., Disp: , Rfl:  .  amitriptyline (ELAVIL) 50 MG tablet, TAKE 1 TABLET BY MOUTH EVERYDAY AT BEDTIME, Disp: 90 tablet, Rfl: 0 .  cyclobenzaprine (FLEXERIL) 10 MG tablet, Take 10 mg by mouth in the morning and at bedtime., Disp: , Rfl: 1 .  eletriptan (RELPAX) 40 MG tablet, TAKE 1 TABLET BY MOUTH AS NEEDED. MAY REPEAT IN 2  HOURS IF NECESSARY. NO MORE THAN 2 IN 24 HOURS., Disp: , Rfl:  .  etodolac (LODINE) 400 MG tablet, TAKE ONE TABLET BY MOUTH 2 TIMES DAILY., Disp: , Rfl:  .  famotidine (PEPCID) 20 MG tablet, One after supper, Disp: 30 tablet, Rfl: 11 .  meclizine (ANTIVERT) 25 MG tablet, Take 25 mg by mouth 3 (three) times daily as needed for dizziness., Disp: , Rfl:  .  montelukast (SINGULAIR) 10 MG tablet, Take by mouth., Disp: , Rfl:  .  omeprazole (PRILOSEC) 40 MG capsule, Take 1 capsule (40 mg total) by mouth daily., Disp: 30 capsule, Rfl: 11 .  ondansetron (ZOFRAN) 4 MG tablet, Take by mouth., Disp: , Rfl:    Family History  Problem Relation Age of Onset  . Kidney failure Mother   . Hypertension Mother   . Migraines Mother   . Skin cancer Mother   . Cervical cancer Mother   . Diabetes Mother   . Hypertension Father   . Diabetes Father   . Breast cancer Paternal Aunt   . Brain cancer Paternal Aunt   . Uterine cancer Maternal Grandmother   . Kidney failure Maternal Grandmother   . Colon cancer Maternal Grandfather   . Skin cancer Maternal Grandfather   . Diabetes Maternal Grandfather   . Kidney failure Paternal Grandfather      Social History   Tobacco Use  . Smoking status: Never Smoker  . Smokeless tobacco: Never Used  Vaping Use  . Vaping Use: Never used  Substance Use Topics  . Alcohol use: No  . Drug use: No    Allergies as of 07/21/2020 - Review Complete 07/21/2020  Allergen Reaction Noted  . Methylprednisolone Other (See Comments) 01/17/2017  . Codeine  09/10/2015  . Dilaudid [hydromorphone hcl]  09/10/2015  . Morphine and related Hives and Itching 05/26/2020  . Penicillins  09/10/2015  . Tramadol Nausea And Vomiting 07/19/2020  . Pantoprazole sodium Diarrhea and Nausea Only 01/10/2017    Review of Systems:    All systems reviewed and negative except where noted in HPI.   Physical Exam:  BP 125/83 (BP Location: Left Arm, Patient Position: Sitting, Cuff Size: Normal)    Pulse (!) 103   Temp 97.8 F (36.6 C) (Oral)   Ht 5' 5.5" (1.664 m)   Wt 194 lb 4 oz (88.1 kg)   BMI 31.83 kg/m  No LMP recorded. Patient has had a hysterectomy.  General:   Alert,  Well-developed, well-nourished, pleasant and cooperative in NAD Head:  Normocephalic and atraumatic. Eyes:  Sclera clear, no icterus.   Conjunctiva pink. Ears:  Normal auditory acuity. Nose:  No deformity, discharge, or lesions. Mouth:  No deformity or lesions,oropharynx pink & moist. Neck:  Supple; no masses or thyromegaly. Lungs:  Respirations even and unlabored.  Clear throughout to auscultation.   No wheezes, crackles, or rhonchi. No acute distress. Heart:  Regular rate and rhythm; no murmurs, clicks, rubs, or gallops. Abdomen:  Normal bowel sounds. Soft, significantly distended, tympanic to percussion without masses, hepatosplenomegaly or hernias noted.  No guarding or rebound tenderness.   Rectal: Not performed Msk:  Symmetrical without gross deformities. Good, equal movement & strength bilaterally. Pulses:  Normal pulses noted. Extremities:  No clubbing or edema.  No cyanosis. Neurologic:  Alert and oriented x3;  grossly normal neurologically. Skin:  Intact without significant lesions or rashes. No jaundice. Psych:  Alert and cooperative. Normal mood and affect.  Imaging Studies: Reviewed  Assessment and Plan:   Tony Friscia is a 45 y.o. white female with h/o IBS-diarrhea predominant, chronic migraine headaches currently maintained on amitriptyline 25 mg, H. pylori infection in 10/21 s/p treatment based on positive H. pylori breath test is seen for follow-up of flareup of irritable bowel syndrome associated with diarrhea  Recommend repeat H. pylori breath test after discontinuation of PPI and H2 blocker for at least 2 weeks Recommend GI profile PCR Trial of IBgard, samples provided Continue metoprolol 25 mg at bedtime If above work-up is unremarkable, will empirically treat with rifaximin for  possible bacterial overgrowth  Chronic GERD Discussed about antireflux lifestyle Hold PPI and H2 blocker temporarily until H. pylori breath test Okay to take Tums in the interim   Follow up in 2-3 months   Cephas Darby, MD

## 2020-07-21 NOTE — Patient Instructions (Signed)
Get H pylori breath test in 2 weeks when you have been off PPI

## 2020-09-20 ENCOUNTER — Ambulatory Visit: Payer: Medicaid Other | Admitting: Gastroenterology

## 2021-03-02 ENCOUNTER — Ambulatory Visit: Payer: Medicaid Other | Admitting: Internal Medicine

## 2021-03-02 NOTE — Progress Notes (Deleted)
Linda Cochran, female    DOB: [birth year]    MRN: [id number]   Brief patient profile:  11 yowf never smoker "always had allergies" going back as far as she can remember and symptoms rhinitis/ itching / sneezing spring and cat exo  never had allergy testing and symptoms have worsened over the years esp since spring of 2021 taking benadryl by the handful with breathing problems and better on singulair and needed less saba / no benadryl and no change on prednisone for back finished 05/23/20 with wt gain "will never take it again" so referred to pulmonary clinic in [facility]  05/26/2020 by Dr Rebecca Eaton.     History of Present Illness  05/26/2020  Pulmonary/ 1st office eval/ Laporscha Linehan / [place]  Baseline wt 187 on singulair and prn saba  Chief Complaint  Patient presents with   Pulmonary Consult    Referred by Dr. Catarina Hartshorn. Pt c/o SOB over the past year, worse the past 4-6 months. She states she gets SOB sometimes at rest and also with exertion such as walking room to room at home.   Dyspnea: swimming at gym due to knee pain / feels not making progress but not losing  ground  Cough: clearing throat non stop daytime/ urinary incont  Sleep: occ wakes up with throat symptoms/ on side / wedge pillow  SABA use: alb q 3d  Sporadic  Already on prilosec 20mg  x one year for overt hb  (protonix caused bad cramps) Has had spells of cough where can't breathe or speak  rec Prilosec 40  Take  30-60 min before first meal of the day and Pepcid (famotidine)  20 mg one after supper  until return to office - this is the best way to tell whether stomach acid is contributing to your problem.   GERD diet  Continue singulair every evening  For drainage / throat tickle try take CHLORPHENIRAMINE  4 mg  (Chlortab 4mg )  Only use your albuterol as a rescue medication Please schedule a follow up office visit in 6 weeks, call sooner if needed    07/19/2020  f/u ov/[facility]/Kastin Cerda re: uacs / no longer on any  inhalers  Chief Complaint  Patient presents with   Follow-up    Shortness of breath with activity (only sometimes)   Dyspnea: house cleaning/ limited by knee L > R Cough: much better x after dinner Sleeping: hob is up on blocks, 2 pillows 1-2 x per week wakes up coughing  SABA use: none needed  02: none Covid status: vax/max  Rec For drainage / throat tickle try take CHLORPHENIRAMINE  4 mg  (Chlortab 4mg )    03/02/2021  f/u ov/[facility]/Micaela Stith re: uacs  No chief complaint on file.   Dyspnea:  *** Cough: *** Sleeping: *** SABA use: *** 02: *** Covid status: *** Lung cancer screening: ***   No obvious day to day or daytime variability or assoc excess/ purulent sputum or mucus plugs or hemoptysis or cp or chest tightness, subjective wheeze or overt sinus or hb symptoms.   *** without nocturnal  or early am exacerbation  of respiratory  c/o's or need for noct saba. Also denies any obvious fluctuation of symptoms with weather or environmental changes or other aggravating or alleviating factors except as outlined above   No unusual exposure hx or h/o childhood pna/ asthma or knowledge of premature birth.  Current Allergies, Complete Past Medical History, Past Surgical History, Family History, and Social History were reviewed in [facility] electronic medical record.  ROS  The following are not active complaints unless bolded Hoarseness, sore throat, dysphagia, dental problems, itching, sneezing,  nasal congestion or discharge of excess mucus or purulent secretions, ear ache,   fever, chills, sweats, unintended wt loss or wt gain, classically pleuritic or exertional cp,  orthopnea pnd or arm/hand swelling  or leg swelling, presyncope, palpitations, abdominal pain, anorexia, nausea, vomiting, diarrhea  or change in bowel habits or change in bladder habits, change in stools or change in urine, dysuria, hematuria,  rash, arthralgias, visual complaints, headache, numbness, weakness  or ataxia or problems with walking or coordination,  change in mood or  memory.        No outpatient medications have been marked as taking for the 03/02/21 encounter (Appointment) with Tanda Rockers, MD.                         Past Medical History:  Diagnosis Date   Acid reflux    Chickenpox    Depression    IBS (irritable bowel syndrome)    Migraine aura, persistent    Papilloma    anus   Vitamin D deficiency         Objective:       03/02/2021        ***  07/19/20 193 lb 6.4 oz (87.7 kg)  05/26/20 194 lb (88 kg)  05/13/20 191 lb (86.6 kg)                     Assessment

## 2021-04-04 ENCOUNTER — Ambulatory Visit (HOSPITAL_COMMUNITY): Payer: Medicaid Other | Admitting: Physical Therapy

## 2021-04-19 ENCOUNTER — Ambulatory Visit (INDEPENDENT_AMBULATORY_CARE_PROVIDER_SITE_OTHER): Payer: Medicaid Other | Admitting: Clinical

## 2021-04-19 ENCOUNTER — Other Ambulatory Visit: Payer: Self-pay

## 2021-04-19 ENCOUNTER — Encounter (HOSPITAL_COMMUNITY): Payer: Self-pay

## 2021-04-19 DIAGNOSIS — F331 Major depressive disorder, recurrent, moderate: Secondary | ICD-10-CM | POA: Diagnosis not present

## 2021-04-19 DIAGNOSIS — F419 Anxiety disorder, unspecified: Secondary | ICD-10-CM

## 2021-04-19 DIAGNOSIS — F4310 Post-traumatic stress disorder, unspecified: Secondary | ICD-10-CM
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
## 2021-04-19 NOTE — Plan of Care (Signed)
Verbal Consent 

## 2021-04-27 ENCOUNTER — Ambulatory Visit (HOSPITAL_COMMUNITY): Payer: Medicaid Other | Attending: Neurosurgery

## 2021-04-27 ENCOUNTER — Other Ambulatory Visit: Payer: Self-pay

## 2021-04-27 DIAGNOSIS — R29898 Other symptoms and signs involving the musculoskeletal system: Secondary | ICD-10-CM | POA: Diagnosis present

## 2021-04-27 DIAGNOSIS — M545 Low back pain, unspecified: Secondary | ICD-10-CM | POA: Diagnosis not present

## 2021-04-27 DIAGNOSIS — G8929 Other chronic pain: Secondary | ICD-10-CM

## 2021-04-27 DIAGNOSIS — M6281 Muscle weakness (generalized): Secondary | ICD-10-CM

## 2021-04-27 NOTE — Therapy (Signed)
[facility] [address] [place], [place], [address] Phone: [phone]   Fax:  [phone]  Physical Therapy Evaluation  Patient Details  Name: Linda Cochran MRN: [id number] Date of Birth: 1976/01/10 Referring Provider (PT): Duffy Rhody, MD   Encounter Date: 04/27/2021   PT End of Session - 04/27/21 1327     Visit Number 1    Number of Visits 12    Date for PT Re-Evaluation 06/08/21    Authorization Type [place] Medicaid [employer], auth sheet filled out at evaluation    PT Start Time 1330    PT Stop Time 1415    PT Time Calculation (min) 45 min    Activity Tolerance Patient tolerated treatment well    Behavior During Therapy [facility] for tasks assessed/performed             Past Medical History:  Diagnosis Date   Acid reflux    Chickenpox    Depression    IBS (irritable bowel syndrome)    Migraine aura, persistent    Papilloma    anus   Vitamin D deficiency     Past Surgical History:  Procedure Laterality Date   ABDOMINAL HYSTERECTOMY     CESAREAN SECTION  2005   CHOLECYSTECTOMY  2007   COLONOSCOPY  2019   CYST REMOVAL NECK  1995   laproscopy  2005   uterine prolapse repair      There were no vitals filed for this visit.    Subjective Assessment - 04/27/21 1331     Subjective Low back pain after a fall 4 years ago when she fell from a ladder and injured her spine with resuling bulging discs and now notes lumbar arthropathy and notes hx of sciatica in her LLE with this initial injury as well. Pt has not had formal physical therapy for this issue. Of note, pt had recent injury to the right ankle which requires her walking in fracture boot and use of crutch. Pt had been swimming for exercise but limited now due to her recent ankle injury    Limitations Sitting;Lifting;Standing;Walking;House hold activities    How long can you sit comfortably? 10 minutes    How long can you stand comfortably? 10 minutes    Patient  Stated Goals Get more active, decrease back pain    Currently in Pain? Yes    Pain Score 6     Pain Location Back    Pain Orientation Lower;Mid    Pain Descriptors / Indicators Aching;Burning;Tiring    Pain Type Chronic pain    Pain Radiating Towards LLE and superior to upper back    Pain Onset More than a month ago    Pain Frequency Intermittent    Aggravating Factors  prolonged standing, walking                [facility] PT Assessment - 04/27/21 0001       Assessment   Medical Diagnosis lumbar facet arthropathy    Referring Provider (PT) Duffy Rhody, MD      Balance Screen   Has the patient fallen in the past 6 months Yes    How many times? 1    Has the patient had a decrease in activity level because of a fear of falling?  No    Is the patient reluctant to leave their home because of a fear of falling?  No      Home Ecologist residence  Type of Home Mobile home    Home Access Ramped entrance    [place] One level    [place]    Additional Comments right crutch      Prior Function   Level of Independence Independent    Vocation Unemployed    Leisure family time, active lifestyle      ROM / Strength   AROM / PROM / Strength AROM;Strength      AROM   AROM Assessment Site Lumbar    Lumbar Flexion WNL    Lumbar Extension WNL, excessive    Lumbar - Right Side Bend WNL    Lumbar - Left Side Bend WNL    Lumbar - Right Rotation WNL    Lumbar - Left Rotation WNL      Strength   Strength Assessment Site Lumbar    Lumbar Flexion 2+/5    Lumbar Extension 2+/5      Flexibility   Soft Tissue Assessment /Muscle Length yes    Hamstrings WNL    Quadriceps WNL      Palpation   Spinal mobility unrevealing      Ambulation/Gait   Gait Comments DNT due to right ankle injury with fracture boot present                        Objective measurements completed on examination: See above findings.                 PT Education - 04/27/21 1411     Education Details education regarding lumbar stabilization    Person(s) Educated Patient    Methods Explanation;Demonstration;Handout    Comprehension Verbalized understanding              PT Short Term Goals - 04/27/21 1416       PT SHORT TERM GOAL #1   Title Patient will be independent with HEP in order to improve functional outcomes.    Time 3    Period Weeks    Status New    Target Date 05/18/21      PT SHORT TERM GOAL #2   Title Patient will report at least 25% improvement in symptoms for improved quality of life.    Baseline able to stand x 10 minutes while washing dishes    Time 3    Period Weeks    Status New    Target Date 05/18/21      PT SHORT TERM GOAL #3   Title Demo 3/5 trunk flexion strength to enhance lumbar stabilization    Baseline 2+/5    Time 3    Period Weeks    Status New    Target Date 05/18/21               PT Long Term Goals - 04/27/21 1419       PT LONG TERM GOAL #1   Title Patient will report at least 50% improvement in symptoms for improved quality of life.    Time 6    Period Weeks    Status New    Target Date 06/08/21      PT LONG TERM GOAL #2   Title Demo trunk strength 4/5 to facilitate improved lumbar stabilization during functional lifts to reduce risk for re-injury    Baseline 2+/5    Time 6    Period Weeks    Status New    Target Date 06/08/21  Plan - 04/27/21 1411     Clinical Impression Statement 45 yo lady with chronic hx of lumbar pain and hx of LLE sciatica who presents with continued pain, postural dysfunction, reduced core strength, decrease in functional activity tolerance, and limited activities.  Pt would benefit from PT services to train/instruct in postural awareness, proper body mechanics, improving strength/stabilization to reduce LBP.    Personal Factors and Comorbidities Comorbidity 1;Time since onset of  injury/illness/exacerbation;Past/Current Experience;Fitness    Comorbidities RA    Examination-Activity Limitations Bend;Carry;Lift;Stand;Stairs;Squat;Sit;Sleep;Locomotion Level;Reach Overhead;Transfers    Examination-Participation Restrictions Cleaning;Community Activity;Driving;Laundry;Yard Work;Shop;Meal Prep    Stability/Clinical Decision Making Stable/Uncomplicated    Clinical Decision Making Low    Rehab Potential Good    PT Frequency 2x / week    PT Duration 6 weeks    PT Treatment/Interventions ADLs/Self Care Home Management;DME Instruction;Traction;Gait training;Stair training;Functional mobility training;Therapeutic activities;Therapeutic exercise;Balance training;Patient/family education;Neuromuscular re-education;Manual techniques;Taping;Dry needling;Spinal Manipulations;Joint Manipulations    PT Next Visit Plan Lumbar stabliziation, flexion-bias    PT Home Exercise Plan PPT, hip flexion isometric (unilat/bilat), DKTC    Consulted and Agree with Plan of Care Patient             Patient will benefit from skilled therapeutic intervention in order to improve the following deficits and impairments:  Decreased activity tolerance, Decreased strength, Hypermobility, Difficulty walking, Postural dysfunction, Improper body mechanics, Pain, Obesity, Impaired perceived functional ability  Visit Diagnosis: Chronic low back pain, unspecified back pain laterality, unspecified whether sciatica present  Muscle weakness (generalized)  Other symptoms and signs involving the musculoskeletal system     Problem List Patient Active Problem List   Diagnosis Date Noted   Left carpal tunnel syndrome 05/29/2020   TFCC (triangular fibrocartilage complex) tear, left, initial encounter 05/29/2020   Cough variant asthma vs UACS 05/26/2020   Family history of breast cancer 12/17/2019   Vasomotor symptoms due to menopause 12/17/2019   Rheumatoid arthritis of multiple sites with negative  rheumatoid factor ([place]) 09/30/2019   Hypertrophied anal papilla 02/08/2017   Migraines 01/10/2017   Vertigo 01/10/2017    Toniann Fail, PT 04/27/2021, 2:21 PM  [facility] [place], [place], [address] Phone: [phone]   Fax:  [phone]  Name: Linda Cochran MRN: [id number] Date of Birth: 1975-10-11

## 2021-05-02 ENCOUNTER — Encounter (HOSPITAL_COMMUNITY): Payer: Medicaid Other | Admitting: Physical Therapy

## 2021-05-03 ENCOUNTER — Other Ambulatory Visit: Payer: Self-pay | Admitting: Internal Medicine

## 2021-05-03 ENCOUNTER — Encounter: Payer: Self-pay | Admitting: Internal Medicine

## 2021-05-03 ENCOUNTER — Encounter (HOSPITAL_COMMUNITY): Payer: Medicaid Other | Admitting: Physical Therapy

## 2021-05-03 ENCOUNTER — Other Ambulatory Visit: Payer: Self-pay

## 2021-05-03 ENCOUNTER — Ambulatory Visit: Payer: Medicaid Other | Admitting: Internal Medicine

## 2021-05-03 DIAGNOSIS — J45991 Cough variant asthma: Secondary | ICD-10-CM

## 2021-05-03 NOTE — Patient Instructions (Signed)
No change in your acid suppression/ diet  Please remember to go to the lab department   for your tests - we will call you with the results when they are available and make the appropriate referral for globus

## 2021-05-03 NOTE — Progress Notes (Addendum)
Linda Cochran, female    DOB: February 15, 1976    MRN: [id number]   Brief patient profile:  59 yowf never smoker "always had allergies" going back as far as she can remember and symptoms rhinitis/ itching / sneezing spring and cat exp  never had allergy testing and symptoms have worsened over the years esp since spring of 2021 taking benadryl by the handful with breathing problems and better on singulair and needed less saba / no benadryl and no change on prednisone when took it for back and finished 05/23/20 with wt gain "will never take it again" so referred to pulmonary clinic in [facility]  05/26/2020 by Dr Rebecca Eaton.     History of Present Illness  05/26/2020  Pulmonary/ 1st office eval/ Wayden Schwertner / [place] [facility]  Baseline wt 187 on singulair and prn saba  Chief Complaint  Patient presents with   Pulmonary Consult    Referred by Dr. Catarina Hartshorn. Pt c/o SOB over the past year, worse the past 4-6 months. She states she gets SOB sometimes at rest and also with exertion such as walking room to room at home.   Dyspnea: swimming at gym due to knee pain / feels not making progress but not losing  ground  Cough: clearing throat non stop daytime/ urinary incont  Sleep: occ wakes up with throat symptoms/ on side / wedge pillow  SABA use: alb q 3d  Sporadic  Already on prilosec 20mg  x one year for overt hb  (protonix caused bad cramps) Has had spells of cough where can't breathe or speak  rec Prilosec 40  Take  30-60 min before first meal of the day and Pepcid (famotidine)  20 mg one after supper  until return to office - this is the best way to tell whether stomach acid is contributing to your problem.   GERD diet  Continue singulair every evening  For drainage / throat tickle try take CHLORPHENIRAMINE  4 mg  (Chlortab 4mg )  Only use your albuterol as a rescue medication Please schedule a follow up office visit in 6 weeks, call sooner if needed    07/19/2020  f/u ov/[place] office/Marques Ericson re: uacs /  no longer on any inhalers  Chief Complaint  Patient presents with   Follow-up    Shortness of breath with activity (only sometimes)   Dyspnea: house cleaning/ limited by knee L > R Cough: much better x after dinner Sleeping: hob is up on blocks, 2 pillows 1-2 x per week wakes up coughing  SABA use: none needed  02: none Covid status: vax/max  Rec For drainage / throat tickle try take CHLORPHENIRAMINE  4 mg  05/03/2021  f/u ov/[place] office/Yue Flanigan re: uacs maint on gerd rx /singulair and prn h1  Chief Complaint  Patient presents with   Follow-up    Feels breathing is worse since breaking her ankle since she cant go to the gym and exercise as she was previously.    Dyspnea:   had improved but worse since 6 weeks prior to OV   Cough: globus sensation 24/7  x  sev months was intermittent  Sleeping: bed blocks on side  SABA IOX:BDZH a week at most and doesn't  really help much   02: none  Covid status: vax x 3      No obvious day to day or daytime variability or assoc excess/ purulent sputum or mucus plugs or hemoptysis or cp or chest tightness, subjective wheeze or overt sinus or hb symptoms.  Sleeping  without nocturnal  or early am exacerbation  of respiratory  c/o's or need for noct saba. Also denies any obvious fluctuation of symptoms with weather or environmental changes or other aggravating or alleviating factors except as outlined above   No unusual exposure hx or h/o childhood pna/ asthma or knowledge of premature birth.  Current Allergies, Complete Past Medical History, Past Surgical History, Family History, and Social History were reviewed in [employer] record.  ROS  The following are not active complaints unless bolded Hoarseness, sore throat, dysphagia, dental problems, itching, sneezing,  nasal congestion or discharge of excess mucus or purulent secretions, ear ache,   fever, chills, sweats, unintended wt loss or wt gain, classically pleuritic  or exertional cp,  orthopnea pnd or arm/hand swelling  or leg swelling, presyncope, palpitations, abdominal pain, anorexia, nausea, vomiting, diarrhea  or change in bowel habits or change in bladder habits, change in stools or change in urine, dysuria, hematuria,  rash, arthralgias, visual complaints, headache, numbness, weakness or ataxia or problems with walking or coordination,  change in mood or  memory.        Current Meds  Medication Sig   albuterol (VENTOLIN HFA) 108 (90 Base) MCG/ACT inhaler Inhale into the lungs.   amitriptyline (ELAVIL) 50 MG tablet TAKE 1 TABLET BY MOUTH EVERYDAY AT BEDTIME (Patient taking differently: 25 mg. TAKE 1 TABLET BY MOUTH EVERYDAY AT BEDTIME)   cyclobenzaprine (FLEXERIL) 10 MG tablet Take 10 mg by mouth in the morning and at bedtime.   eletriptan (RELPAX) 40 MG tablet TAKE 1 TABLET BY MOUTH AS NEEDED. MAY REPEAT IN 2 HOURS IF NECESSARY. NO MORE THAN 2 IN 24 HOURS.   etodolac (LODINE) 400 MG tablet TAKE ONE TABLET BY MOUTH 2 TIMES DAILY.   famotidine (PEPCID) 20 MG tablet One after supper   meclizine (ANTIVERT) 25 MG tablet Take 25 mg by mouth 3 (three) times daily as needed for dizziness.   montelukast (SINGULAIR) 10 MG tablet Take by mouth.   omeprazole (PRILOSEC) 40 MG capsule Take 1 capsule (40 mg total) by mouth daily.   ondansetron (ZOFRAN) 4 MG tablet Take by mouth.             Past Medical History:  Diagnosis Date   Acid reflux    Chickenpox    Depression    IBS (irritable bowel syndrome)    Migraine aura, persistent    Papilloma    anus   Vitamin D deficiency         Objective:       05/03/2021      188    07/19/20 193 lb 6.4 oz (87.7 kg)  05/26/20 194 lb (88 kg)  05/13/20 191 lb (86.6 kg)     Vital signs reviewed  05/03/2021  - Note at rest 02 sats  97% on RA   General appearance:    amb wf nad/ wearing R boot / freq throat clearing with classic pseudoweeze   HEENT : orophx is pristine despite sensation of "constant  pnds"   NECK :  without JVD/Nodes/TM/ nl carotid upstrokes bilaterally   LUNGS: no acc muscle use,  Nl contour chest which is clear to A and P bilaterally without cough on insp or exp maneuvers   CV:  RRR  no s3 or murmur or increase in P2, and no edema   ABD:  soft and nontender with nl inspiratory excursion in the supine position. No bruits or organomegaly appreciated, bowel sounds  nl  MS:  Nl gait/ ext warm without deformities, calf tenderness, cyanosis or clubbing No obvious joint restrictions   SKIN: warm and dry without lesions    NEURO:  alert, approp, nl sensorium with  no motor or cerebellar deficits apparent.          Labs ordered 05/03/2021  :  allergy profile              Assessment

## 2021-05-04 ENCOUNTER — Encounter: Payer: Self-pay | Admitting: Internal Medicine

## 2021-05-04 NOTE — Assessment & Plan Note (Signed)
Onset of allergies as child mostly rhinitis/ min cough sob  - rhinitis  improved on singlair spring 2021 but not the cough nor did it respond to prednisone - max gerd rx advised 05/26/2020  > cough resolved 07/19/2020 on gerd rx plus singulair  - Allergy profile 05/03/2021 >  Eos 0. /  IgE    Informs me today that the globus never improved even p h1 eliminated the excess mucus and she has pseudowheeze today typical of VCD from Upper airway cough syndrome (previously labeled PNDS),  is so named because it's frequently impossible to sort out how much is  CR/sinusitis with freq throat clearing (which can be related to primary GERD)   vs  causing  secondary (" extra esophageal")  GERD from wide swings in gastric pressure that occur with throat clearing, often  promoting self use of mint and menthol lozenges that reduce the lower esophageal sphincter tone and exacerbate the problem further in a cyclical fashion.   These are the same pts (now being labeled as having "irritable larynx syndrome" by some cough centers) who not infrequently have a history of having failed to tolerate ace inhibitors,  dry powder inhalers or biphosphonates or report having atypical/extraesophageal reflux symptoms that don't respond to standard doses of PPI  and are easily confused as having aecopd or asthma flares by even experienced allergists/ pulmonologists (myself included).   Rec: Allergy screen and unless convincingly pos rec referral to Bettina Gavia at [facility]   No change in meds/ diet/ bed blocks in meantime         Each maintenance medication was reviewed in detail including emphasizing most importantly the difference between maintenance and prns and under what circumstances the prns are to be triggered using an action plan format where appropriate.  Total time for H and P, chart review, counseling,  and generating customized AVS unique to this office visit / same day charting = 26 min

## 2021-05-07 LAB — CBC WITH DIFFERENTIAL/PLATELET
Basophils Absolute: 0.1 10*3/uL (ref 0.0–0.2)
Basos: 1 %
EOS (ABSOLUTE): 0.3 10*3/uL (ref 0.0–0.4)
Eos: 3 %
Hematocrit: 45.6 % (ref 34.0–46.6)
Hemoglobin: 15.3 g/dL (ref 11.1–15.9)
Immature Grans (Abs): 0 10*3/uL (ref 0.0–0.1)
Immature Granulocytes: 0 %
Lymphocytes Absolute: 2.4 10*3/uL (ref 0.7–3.1)
Lymphs: 26 %
MCH: 27.5 pg (ref 26.6–33.0)
MCHC: 33.6 g/dL (ref 31.5–35.7)
MCV: 82 fL (ref 79–97)
Monocytes Absolute: 0.5 10*3/uL (ref 0.1–0.9)
Monocytes: 5 %
Neutrophils Absolute: 6 10*3/uL (ref 1.4–7.0)
Neutrophils: 65 %
Platelets: 292 10*3/uL (ref 150–450)
RBC: 5.57 x10E6/uL — ABNORMAL HIGH (ref 3.77–5.28)
RDW: 12.2 % (ref 11.7–15.4)
WBC: 9.2 10*3/uL (ref 3.4–10.8)

## 2021-05-07 LAB — IGE: IgE (Immunoglobulin E), Serum: 284 IU/mL (ref 6–495)

## 2021-05-09 ENCOUNTER — Telehealth: Payer: Self-pay

## 2021-05-09 NOTE — Telephone Encounter (Signed)
ATC patient. LMTCB with RDS office number  

## 2021-05-09 NOTE — Telephone Encounter (Signed)
-----   Message from Tanda Rockers, MD sent at 05/07/2021  9:54 AM EST ----- Call patient :  Study is c/w moderate allergy but just a screen so rec do allergy eval in [place] prior to sending to [facility] for ent if allergy rx not helpful

## 2021-05-10 ENCOUNTER — Ambulatory Visit (HOSPITAL_COMMUNITY): Payer: Medicaid Other | Admitting: Physical Therapy

## 2021-05-10 ENCOUNTER — Telehealth: Payer: Self-pay

## 2021-05-10 ENCOUNTER — Other Ambulatory Visit: Payer: Self-pay

## 2021-05-10 ENCOUNTER — Ambulatory Visit (INDEPENDENT_AMBULATORY_CARE_PROVIDER_SITE_OTHER): Payer: Medicaid Other | Admitting: Clinical

## 2021-05-10 DIAGNOSIS — M545 Low back pain, unspecified: Secondary | ICD-10-CM | POA: Diagnosis not present

## 2021-05-10 DIAGNOSIS — G8929 Other chronic pain: Secondary | ICD-10-CM

## 2021-05-10 DIAGNOSIS — F4310 Post-traumatic stress disorder, unspecified: Secondary | ICD-10-CM

## 2021-05-10 DIAGNOSIS — F419 Anxiety disorder, unspecified: Secondary | ICD-10-CM

## 2021-05-10 DIAGNOSIS — F331 Major depressive disorder, recurrent, moderate: Secondary | ICD-10-CM

## 2021-05-10 DIAGNOSIS — R29898 Other symptoms and signs involving the musculoskeletal system: Secondary | ICD-10-CM

## 2021-05-10 DIAGNOSIS — M6281 Muscle weakness (generalized): Secondary | ICD-10-CM

## 2021-05-10 NOTE — Progress Notes (Signed)
Virtual Visit via Telephone Note  I connected with Linda Cochran on 05/10/21 at 11:00 AM EST by telephone and verified that I am speaking with the correct person using two identifiers.  Location: Patient: Home Provider: Office   I discussed the limitations, risks, security and privacy concerns of performing an evaluation and management service by telephone and the availability of in person appointments. I also discussed with the patient that there may be a patient responsible charge related to this service. The patient expressed understanding and agreed to proceed.   THERAPIST PROGRESS NOTE   Session Time: 11:00 AM-11:45 AM   Participation Level: Active   Behavioral Response: CasualAlertDepressed   Type of Therapy: Individual Therapy   Treatment Goals addressed: Anger and Coping   Interventions: CBT, Motivational Interviewing, Solution Focused and Strength-based   Summary: Linda Cochran is a 45 y.o. female who presents with Depression Anxiety and PTSD. The OPT therapist worked with the patient for her initial OPT treatment. The OPT therapist utilized Motivational Interviewing to assist in creating therapeutic repore. The patient in the session was engaged and work in collaboration giving feedback about her triggers and symptoms over the past few weeks. The patient spoke about her frustration in dealing with recovery from a fall having neck/back/and foot pain. The patient spoke about frustration with being in a boot and having limited mobility due to pain. The patient spoke about holiday related stress and family interactions. The patient will be starting a 6 week PT program to rehab her physical health injuries.The OPT therapist utilized Cognitive Behavioral Therapy through cognitive restructuring as well as worked with the patient on coping strategies to assist in management of mood. The OPT therapist worked in the session with the patient on challenging negative thoughts and implementing  positive thinking and working within her limits stay healthy and active. The OPT therapist worked with the patient overiewing her basic health areas of sleep/eating/physical exercise/ and hygiene.   Suicidal/Homicidal: Nowithout intent/plan   Therapist Response: The OPT therapist worked with the patient for the patients scheduled session. The patient was engaged in her session and gave feedback in relation to triggers, symptoms, and behavior responses over the past few weeks. The OPT therapist worked with the patient utilizing an in session Cognitive Behavioral Therapy exercise. The patient was responsive in the session and verbalized, " It has been frustrating I am so use to being active and I have been limited due to these health problems".  The OPT therapist worked with the patient about being mindful of her emotions and mood and doing self checkins throughout her week and implementing coping as needed to keep the patient at her mood baseline. The OPT therapist worked with the patient on self care focus.The OPT therapist reviewed the importance of continuing to be consistent with all recommendations and scheduled health appointments.   Plan: Return again in 2/3 weeks.   Diagnosis:      Axis I: Depression/Anxiety/and PTSD                          Axis II: No diagnosis   I discussed the assessment and treatment plan with the patient. The patient was provided an opportunity to ask questions and all were answered. The patient agreed with the plan and demonstrated an understanding of the instructions.   The patient was advised to call back or seek an in-person evaluation if the symptoms worsen or if the condition fails to improve as anticipated.  I provided 45 minutes of non-face-to-face time during this encounter.   Warnie Belair T. Eulas Post, Marlinda Mike   05/10/2021

## 2021-05-10 NOTE — Telephone Encounter (Signed)
-----   Message from Tanda Rockers, MD sent at 05/07/2021  9:54 AM EST ----- Call patient :  Study is c/w moderate allergy but just a screen so rec do allergy eval in [place] prior to sending to [facility] for ent if allergy rx not helpful

## 2021-05-10 NOTE — Telephone Encounter (Signed)
ATC patient.  LMTCB. 

## 2021-05-10 NOTE — Therapy (Signed)
[facility] [address] [place], [place], [address] Phone: [phone]   Fax:  [phone]  Physical Therapy Treatment  Patient Details  Name: Linda Cochran MRN: [id number] Date of Birth: 1975/06/18 Referring Provider (PT): Duffy Rhody, MD   Encounter Date: 05/10/2021   PT End of Session - 05/10/21 1529     Visit Number 2    Number of Visits 12    Date for PT Re-Evaluation 06/08/21    Authorization Type [place] Medicaid [employer], auth sheet filled out at evaluation    PT Start Time 1405    PT Stop Time 1450    PT Time Calculation (min) 45 min    Activity Tolerance Patient tolerated treatment well    Behavior During Therapy [facility] for tasks assessed/performed             Past Medical History:  Diagnosis Date   Acid reflux    Chickenpox    Depression    IBS (irritable bowel syndrome)    Migraine aura, persistent    Papilloma    anus   Vitamin D deficiency     Past Surgical History:  Procedure Laterality Date   ABDOMINAL HYSTERECTOMY     CESAREAN SECTION  2005   CHOLECYSTECTOMY  2007   COLONOSCOPY  2019   CYST REMOVAL NECK  1995   laproscopy  2005   uterine prolapse repair      There were no vitals filed for this visit.   Subjective Assessment - 05/10/21 1412     Subjective Pt returns today stating her orthopedic wants her to start therapy on her ankle but knows her insurance will not pay for both lumbar and ankle.  Pt continues to walk with aircast, no AD.  States she is also having headaches since the fall.    Currently in Pain? Yes    Pain Score 4     Pain Location Back    Pain Orientation Lower;Medial    Pain Descriptors / Indicators Aching                               [facility] Adult PT Treatment/Exercise - 05/10/21 0001       Knee/Hip Exercises: Stretches   Active Hamstring Stretch Both;3 reps;30 seconds    Active Hamstring Stretch Limitations supine with towel      Knee/Hip  Exercises: Supine   Bridges Both;10 reps    Straight Leg Raises Both;10 reps    Other Supine Knee/Hip Exercises abdominal isometrics 10X5"      Knee/Hip Exercises: Sidelying   Hip ABduction Both;10 reps                     PT Education - 05/10/21 1421     Education Details Reviewed goals and POC moving forward.  Educated on core stability and importance.    Person(s) Educated Patient    Methods Explanation;Demonstration;Tactile cues;Verbal cues    Comprehension Verbalized understanding;Returned demonstration;Verbal cues required;Tactile cues required              PT Short Term Goals - 05/10/21 1437       PT SHORT TERM GOAL #1   Title Patient will be independent with HEP in order to improve functional outcomes.    Time 3    Period Weeks    Status On-going    Target Date 05/18/21      PT SHORT TERM GOAL #  2   Title Patient will report at least 25% improvement in symptoms for improved quality of life.    Baseline able to stand x 10 minutes while washing dishes    Time 3    Period Weeks    Status On-going    Target Date 05/18/21      PT SHORT TERM GOAL #3   Title Demo 3/5 trunk flexion strength to enhance lumbar stabilization    Baseline 2+/5    Time 3    Period Weeks    Status On-going    Target Date 05/18/21               PT Long Term Goals - 05/10/21 1438       PT LONG TERM GOAL #1   Title Patient will report at least 50% improvement in symptoms for improved quality of life.    Time 6    Period Weeks    Status On-going      PT LONG TERM GOAL #2   Title Demo trunk strength 4/5 to facilitate improved lumbar stabilization during functional lifts to reduce risk for re-injury    Baseline 2+/5    Time 6    Period Weeks    Status On-going                   Plan - 05/10/21 1505     Clinical Impression Statement Reviewed goals, HEP and POC moving forward.  Added LE strengthening exercises as well as core stabilization.   Pt able to  complete in good form and stabilization overall with cues for eccentric lowering with SLR. Tightness noted in bilateral hamstrings with addition to HEP as well as other exercises added this session. Pt with multiple issues currently but focus is on lumbar at this time.    Personal Factors and Comorbidities Comorbidity 1;Time since onset of injury/illness/exacerbation;Past/Current Experience;Fitness    Comorbidities RA    Examination-Activity Limitations Bend;Carry;Lift;Stand;Stairs;Squat;Sit;Sleep;Locomotion Level;Reach Overhead;Transfers    Examination-Participation Restrictions Cleaning;Community Activity;Driving;Laundry;Yard Work;Shop;Meal Prep    Stability/Clinical Decision Making Stable/Uncomplicated    Rehab Potential Good    PT Frequency 2x / week    PT Duration 6 weeks    PT Treatment/Interventions ADLs/Self Care Home Management;DME Instruction;Traction;Gait training;Stair training;Functional mobility training;Therapeutic activities;Therapeutic exercise;Balance training;Patient/family education;Neuromuscular re-education;Manual techniques;Taping;Dry needling;Spinal Manipulations;Joint Manipulations    PT Next Visit Plan Lumbar stabliziation, flexion-bias    PT Home Exercise Plan PPT, hip flexion isometric (unilat/bilat), DKTC    Consulted and Agree with Plan of Care Patient             Patient will benefit from skilled therapeutic intervention in order to improve the following deficits and impairments:  Decreased activity tolerance, Decreased strength, Hypermobility, Difficulty walking, Postural dysfunction, Improper body mechanics, Pain, Obesity, Impaired perceived functional ability  Visit Diagnosis: Muscle weakness (generalized)  Chronic low back pain, unspecified back pain laterality, unspecified whether sciatica present  Other symptoms and signs involving the musculoskeletal system     Problem List Patient Active Problem List   Diagnosis Date Noted   Left carpal tunnel  syndrome 05/29/2020   TFCC (triangular fibrocartilage complex) tear, left, initial encounter 05/29/2020   Cough variant asthma vs UACS 05/26/2020   Family history of breast cancer 12/17/2019   Vasomotor symptoms due to menopause 12/17/2019   Rheumatoid arthritis of multiple sites with negative rheumatoid factor ([place]) 09/30/2019   Hypertrophied anal papilla 02/08/2017   Migraines 01/10/2017   Vertigo 01/10/2017   Teena Irani, PTA/CLT, Lissa Morales [phone]  Teena Irani, PTA 05/10/2021, 3:30 PM  [place], [place], [address] Phone: [phone]   Fax:  [phone]  Name: Linda Cochran MRN: [id number] Date of Birth: [birth year]

## 2021-05-11 ENCOUNTER — Other Ambulatory Visit: Payer: Self-pay

## 2021-05-11 DIAGNOSIS — J45991 Cough variant asthma: Secondary | ICD-10-CM

## 2021-05-17 ENCOUNTER — Other Ambulatory Visit: Payer: Self-pay

## 2021-05-17 ENCOUNTER — Ambulatory Visit (HOSPITAL_COMMUNITY): Payer: Medicaid Other | Attending: Neurosurgery

## 2021-05-17 DIAGNOSIS — M545 Low back pain, unspecified: Secondary | ICD-10-CM | POA: Diagnosis present

## 2021-05-17 DIAGNOSIS — G8929 Other chronic pain: Secondary | ICD-10-CM | POA: Diagnosis present

## 2021-05-17 DIAGNOSIS — M6281 Muscle weakness (generalized): Secondary | ICD-10-CM | POA: Diagnosis present

## 2021-05-17 DIAGNOSIS — R29898 Other symptoms and signs involving the musculoskeletal system: Secondary | ICD-10-CM | POA: Diagnosis present

## 2021-05-17 NOTE — Therapy (Signed)
[facility] [address] [place], [place], [address] Phone: [phone]   Fax:  [phone]  Physical Therapy Treatment  Patient Details  Name: Linda Cochran MRN: [id number] Date of Birth: 1976-01-30 Referring Provider (PT): Duffy Rhody, MD   Encounter Date: 05/17/2021   PT End of Session - 05/17/21 1254     Visit Number 3    Number of Visits 12    Date for PT Re-Evaluation 06/08/21    Authorization Type [place] [employer], auth sheet filled out at evaluation    PT Start Time 1300    PT Stop Time 1345    PT Time Calculation (min) 45 min    Activity Tolerance Patient tolerated treatment well    Behavior During Therapy [facility] for tasks assessed/performed             Past Medical History:  Diagnosis Date   Acid reflux    Chickenpox    Depression    IBS (irritable bowel syndrome)    Migraine aura, persistent    Papilloma    anus   Vitamin D deficiency     Past Surgical History:  Procedure Laterality Date   ABDOMINAL HYSTERECTOMY     CESAREAN SECTION  2005   CHOLECYSTECTOMY  2007   COLONOSCOPY  2019   CYST REMOVAL NECK  1995   laproscopy  2005   uterine prolapse repair      There were no vitals filed for this visit.   Subjective Assessment - 05/17/21 1300     Subjective Pt reports she was out of town for a few days on a longer car trip and notes increase pain after sitting in car/walking during trip    Currently in Pain? Yes    Pain Score 6     Pain Location Back    Pain Orientation Lower;Medial    Pain Descriptors / Indicators Aching    Pain Type Chronic pain                [facility] PT Assessment - 05/17/21 0001       Assessment   Medical Diagnosis lumbar facet arthropathy    Referring Provider (PT) Duffy Rhody, MD                           [facility] Adult PT Treatment/Exercise - 05/17/21 0001       Exercises   Exercises Lumbar      Lumbar Exercises: Stretches   Double  Knee to Chest Stretch 2 reps;30 seconds    Lower Trunk Rotation 2 reps;60 seconds      Lumbar Exercises: Aerobic   Recumbent Bike level 5 30 sec on 1 min light x 8 min      Lumbar Exercises: Quadruped   Single Arm Raise Right;Left;5 reps    Straight Leg Raise 5 reps    Opposite Arm/Leg Raise Right arm/Left leg;Left arm/Right leg;10 reps      Knee/Hip Exercises: Supine   Bridges Strengthening;Both;2 sets;10 reps    Straight Leg Raises Strengthening;Both;2 sets;10 reps      Knee/Hip Exercises: Sidelying   Hip ABduction Strengthening;Both;2 sets;10 reps                     PT Education - 05/17/21 1331     Education Details education on training method    Person(s) Educated Patient    Methods Explanation    Comprehension Verbalized understanding  PT Short Term Goals - 05/10/21 1437       PT SHORT TERM GOAL #1   Title Patient will be independent with HEP in order to improve functional outcomes.    Time 3    Period Weeks    Status On-going    Target Date 05/18/21      PT SHORT TERM GOAL #2   Title Patient will report at least 25% improvement in symptoms for improved quality of life.    Baseline able to stand x 10 minutes while washing dishes    Time 3    Period Weeks    Status On-going    Target Date 05/18/21      PT SHORT TERM GOAL #3   Title Demo 3/5 trunk flexion strength to enhance lumbar stabilization    Baseline 2+/5    Time 3    Period Weeks    Status On-going    Target Date 05/18/21               PT Long Term Goals - 05/10/21 1438       PT LONG TERM GOAL #1   Title Patient will report at least 50% improvement in symptoms for improved quality of life.    Time 6    Period Weeks    Status On-going      PT LONG TERM GOAL #2   Title Demo trunk strength 4/5 to facilitate improved lumbar stabilization during functional lifts to reduce risk for re-injury    Baseline 2+/5    Time 6    Period Weeks    Status On-going                    Plan - 05/17/21 1334     Clinical Impression Statement Tolerating tx sessions well with addition of repetitions for BLE strength and addition of metabolic conditioning at end of session to improve activity tolerance.  Progressing well with lumbar stabilization exercises and able to tolerate increased activities with right ankle today. Continued sessions indicated to progress lumbar stabilization and progress to functional lifts to reduce back pain and risk for re-injury    Personal Factors and Comorbidities Comorbidity 1;Time since onset of injury/illness/exacerbation;Past/Current Experience;Fitness    Comorbidities RA    Examination-Activity Limitations Bend;Carry;Lift;Stand;Stairs;Squat;Sit;Sleep;Locomotion Level;Reach Overhead;Transfers    Examination-Participation Restrictions Cleaning;Community Activity;Driving;Laundry;Yard Work;Shop;Meal Prep    Stability/Clinical Decision Making Stable/Uncomplicated    Rehab Potential Good    PT Frequency 2x / week    PT Duration 6 weeks    PT Treatment/Interventions ADLs/Self Care Home Management;DME Instruction;Traction;Gait training;Stair training;Functional mobility training;Therapeutic activities;Therapeutic exercise;Balance training;Patient/family education;Neuromuscular re-education;Manual techniques;Taping;Dry needling;Spinal Manipulations;Joint Manipulations    PT Next Visit Plan Lumbar stabliziation, flexion-bias    PT Home Exercise Plan PPT, hip flexion isometric (unilat/bilat), DKTC    Consulted and Agree with Plan of Care Patient             Patient will benefit from skilled therapeutic intervention in order to improve the following deficits and impairments:  Decreased activity tolerance, Decreased strength, Hypermobility, Difficulty walking, Postural dysfunction, Improper body mechanics, Pain, Obesity, Impaired perceived functional ability  Visit Diagnosis: Muscle weakness (generalized)  Chronic low back pain,  unspecified back pain laterality, unspecified whether sciatica present  Other symptoms and signs involving the musculoskeletal system     Problem List Patient Active Problem List   Diagnosis Date Noted   Left carpal tunnel syndrome 05/29/2020   TFCC (triangular fibrocartilage complex) tear, left, initial encounter 05/29/2020  Cough variant asthma vs UACS 05/26/2020   Family history of breast cancer 12/17/2019   Vasomotor symptoms due to menopause 12/17/2019   Rheumatoid arthritis of multiple sites with negative rheumatoid factor ([place]) 09/30/2019   Hypertrophied anal papilla 02/08/2017   Migraines 01/10/2017   Vertigo 01/10/2017    Toniann Fail, PT 05/17/2021, 1:40 PM  [facility] [address] [place], [place], [address] Phone: [phone]   Fax:  [phone]  Name: Linda Cochran MRN: [id number] Date of Birth: 1976/05/08

## 2021-05-19 ENCOUNTER — Ambulatory Visit (HOSPITAL_COMMUNITY): Payer: Medicaid Other | Admitting: Physical Therapy

## 2021-05-19 ENCOUNTER — Telehealth (HOSPITAL_COMMUNITY): Payer: Self-pay | Admitting: Physical Therapy

## 2021-05-19 NOTE — Telephone Encounter (Signed)
Called patient about missed appt today. Patient states she cancelled visit yesterday due to her child is sick. Reminder given of upcoming appt next Tuesday.   5:21 PM, 05/19/21 Josue Hector PT DPT  Physical Therapist with [facility]  [phone]

## 2021-05-24 ENCOUNTER — Ambulatory Visit (HOSPITAL_COMMUNITY): Payer: Medicaid Other

## 2021-05-24 ENCOUNTER — Other Ambulatory Visit: Payer: Self-pay

## 2021-05-24 DIAGNOSIS — M6281 Muscle weakness (generalized): Secondary | ICD-10-CM

## 2021-05-24 DIAGNOSIS — R29898 Other symptoms and signs involving the musculoskeletal system: Secondary | ICD-10-CM

## 2021-05-24 DIAGNOSIS — G8929 Other chronic pain: Secondary | ICD-10-CM

## 2021-05-24 DIAGNOSIS — M545 Low back pain, unspecified: Secondary | ICD-10-CM

## 2021-05-24 NOTE — Therapy (Signed)
[facility] [address] [place], [place], [address] Phone: [phone]   Fax:  [phone]  Physical Therapy Treatment  Patient Details  Name: Linda Cochran MRN: [id number] Date of Birth: [birth year] Referring Provider (PT): Duffy Rhody, MD   Encounter Date: 05/24/2021   PT End of Session - 05/24/21 1427     Visit Number 4    Number of Visits 12    Date for PT Re-Evaluation 06/08/21    Authorization Type [place] Medicaid [employer], auth sheet filled out at evaluation    PT Start Time 1430    PT Stop Time 1515    PT Time Calculation (min) 45 min    Activity Tolerance Patient tolerated treatment well    Behavior During Therapy [facility] for tasks assessed/performed             Past Medical History:  Diagnosis Date   Acid reflux    Chickenpox    Depression    IBS (irritable bowel syndrome)    Migraine aura, persistent    Papilloma    anus   Vitamin D deficiency     Past Surgical History:  Procedure Laterality Date   ABDOMINAL HYSTERECTOMY     CESAREAN SECTION  2005   CHOLECYSTECTOMY  2007   COLONOSCOPY  2019   CYST REMOVAL NECK  1995   laproscopy  2005   uterine prolapse repair      There were no vitals filed for this visit.   Subjective Assessment - 05/24/21 1431     Subjective Pt reports feeling very sore from cleaning her house yesterday    Limitations Sitting;Lifting;Standing;Walking;House hold activities    Currently in Pain? Yes    Pain Score 6     Pain Location Back    Pain Orientation Lower;Medial    Pain Descriptors / Indicators Aching    Pain Type Chronic pain    Pain Onset More than a month ago    Pain Frequency Intermittent                [facility] PT Assessment - 05/24/21 0001       Assessment   Medical Diagnosis lumbar facet arthropathy    Referring Provider (PT) Duffy Rhody, MD                           [facility] Adult PT Treatment/Exercise - 05/24/21 0001       Lumbar  Exercises: Stretches   Double Knee to Chest Stretch 2 reps;30 seconds    Lower Trunk Rotation 2 reps;60 seconds    Prone on Elbows Stretch 5 reps;10 seconds    Press Ups 5 reps;10 seconds      Lumbar Exercises: Supine   Dead Bug 15 reps;2 seconds      Lumbar Exercises: Prone   Other Prone Lumbar Exercises heel squeeze 2x10      Lumbar Exercises: Quadruped   Single Arm Raise Right;Left;10 reps    Straight Leg Raise 10 reps    Opposite Arm/Leg Raise Right arm/Left leg;Left arm/Right leg;10 reps                       PT Short Term Goals - 05/10/21 1437       PT SHORT TERM GOAL #1   Title Patient will be independent with HEP in order to improve functional outcomes.    Time 3    Period Weeks  Status On-going    Target Date 05/18/21      PT SHORT TERM GOAL #2   Title Patient will report at least 25% improvement in symptoms for improved quality of life.    Baseline able to stand x 10 minutes while washing dishes    Time 3    Period Weeks    Status On-going    Target Date 05/18/21      PT SHORT TERM GOAL #3   Title Demo 3/5 trunk flexion strength to enhance lumbar stabilization    Baseline 2+/5    Time 3    Period Weeks    Status On-going    Target Date 05/18/21               PT Long Term Goals - 05/10/21 1438       PT LONG TERM GOAL #1   Title Patient will report at least 50% improvement in symptoms for improved quality of life.    Time 6    Period Weeks    Status On-going      PT LONG TERM GOAL #2   Title Demo trunk strength 4/5 to facilitate improved lumbar stabilization during functional lifts to reduce risk for re-injury    Baseline 2+/5    Time 6    Period Weeks    Status On-going                   Plan - 05/24/21 1511     Clinical Impression Statement Tolerating tx sessions well and progressing with trunk strength/stabilization exercises without adverse effects.  Pt reports her right ankle is healing well and she is able  to perform limited single leg stance on it now.  Continued sessions inidcated to progress core/trunk strength to generalize to functional lifts to reduce risk for re-injury    Personal Factors and Comorbidities Comorbidity 1;Time since onset of injury/illness/exacerbation;Past/Current Experience;Fitness    Comorbidities RA    Examination-Activity Limitations Bend;Carry;Lift;Stand;Stairs;Squat;Sit;Sleep;Locomotion Level;Reach Overhead;Transfers    Examination-Participation Restrictions Cleaning;Community Activity;Driving;Laundry;Yard Work;Shop;Meal Prep    Stability/Clinical Decision Making Stable/Uncomplicated    Rehab Potential Good    PT Frequency 2x / week    PT Duration 6 weeks    PT Treatment/Interventions ADLs/Self Care Home Management;DME Instruction;Traction;Gait training;Stair training;Functional mobility training;Therapeutic activities;Therapeutic exercise;Balance training;Patient/family education;Neuromuscular re-education;Manual techniques;Taping;Dry needling;Spinal Manipulations;Joint Manipulations    PT Next Visit Plan Lumbar stabliziation, flexion-bias    PT Home Exercise Plan PPT, hip flexion isometric (unilat/bilat), DKTC    Consulted and Agree with Plan of Care Patient             Patient will benefit from skilled therapeutic intervention in order to improve the following deficits and impairments:  Decreased activity tolerance, Decreased strength, Hypermobility, Difficulty walking, Postural dysfunction, Improper body mechanics, Pain, Obesity, Impaired perceived functional ability  Visit Diagnosis: Muscle weakness (generalized)  Chronic low back pain, unspecified back pain laterality, unspecified whether sciatica present  Other symptoms and signs involving the musculoskeletal system     Problem List Patient Active Problem List   Diagnosis Date Noted   Left carpal tunnel syndrome 05/29/2020   TFCC (triangular fibrocartilage complex) tear, left, initial encounter  05/29/2020   Cough variant asthma vs UACS 05/26/2020   Family history of breast cancer 12/17/2019   Vasomotor symptoms due to menopause 12/17/2019   Rheumatoid arthritis of multiple sites with negative rheumatoid factor ([place]) 09/30/2019   Hypertrophied anal papilla 02/08/2017   Migraines 01/10/2017   Vertigo 01/10/2017    Cornelia Copa  Karie Kirks, PT 05/24/2021, 3:13 PM  [facility] [address] [place], [place], [address] Phone: [phone]   Fax:  [phone]  Name: Xavier Fournier MRN: [id number] Date of Birth: 09-12-1975

## 2021-05-31 ENCOUNTER — Ambulatory Visit (HOSPITAL_COMMUNITY): Payer: Medicaid Other

## 2021-06-01 ENCOUNTER — Other Ambulatory Visit: Payer: Self-pay

## 2021-06-01 ENCOUNTER — Ambulatory Visit (HOSPITAL_COMMUNITY): Payer: Medicaid Other | Admitting: Physical Therapy

## 2021-06-01 ENCOUNTER — Ambulatory Visit (INDEPENDENT_AMBULATORY_CARE_PROVIDER_SITE_OTHER): Payer: Medicaid Other | Admitting: Clinical

## 2021-06-01 DIAGNOSIS — F331 Major depressive disorder, recurrent, moderate: Secondary | ICD-10-CM

## 2021-06-01 DIAGNOSIS — M545 Low back pain, unspecified: Secondary | ICD-10-CM

## 2021-06-01 DIAGNOSIS — F4310 Post-traumatic stress disorder, unspecified: Secondary | ICD-10-CM

## 2021-06-01 DIAGNOSIS — F419 Anxiety disorder, unspecified: Secondary | ICD-10-CM

## 2021-06-01 DIAGNOSIS — M6281 Muscle weakness (generalized): Secondary | ICD-10-CM | POA: Diagnosis not present

## 2021-06-01 DIAGNOSIS — R29898 Other symptoms and signs involving the musculoskeletal system: Secondary | ICD-10-CM

## 2021-06-01 DIAGNOSIS — G8929 Other chronic pain: Secondary | ICD-10-CM

## 2021-06-01 NOTE — Therapy (Signed)
[facility] [address] [place], [place], [address] Phone: [phone]   Fax:  [phone]  Physical Therapy Treatment  Patient Details  Name: Linda Cochran MRN: [id number] Date of Birth: [birth year] Referring Provider (PT): Duffy Rhody, MD   Encounter Date: 06/01/2021   PT End of Session - 06/01/21 1445     Visit Number 5    Number of Visits 12    Date for PT Re-Evaluation 06/08/21    Authorization Type [place] Medicaid [employer], auth sheet filled out at evaluation    PT Start Time 1415    PT Stop Time 1445    PT Time Calculation (min) 30 min    Activity Tolerance Patient tolerated treatment well    Behavior During Therapy [facility] for tasks assessed/performed             Past Medical History:  Diagnosis Date   Acid reflux    Chickenpox    Depression    IBS (irritable bowel syndrome)    Migraine aura, persistent    Papilloma    anus   Vitamin D deficiency     Past Surgical History:  Procedure Laterality Date   ABDOMINAL HYSTERECTOMY     CESAREAN SECTION  2005   CHOLECYSTECTOMY  2007   COLONOSCOPY  2019   CYST REMOVAL NECK  1995   laproscopy  2005   uterine prolapse repair      There were no vitals filed for this visit.   Subjective Assessment - 06/01/21 1423     Subjective pt states her back or ankle is no better.  STates she does her exercises at least once daily but usually has a house full of kids.  STates she's been cleaning her house and it has aggrevated it alot too.    Currently in Pain? Yes    Pain Score 7     Pain Location Back                               [facility] Adult PT Treatment/Exercise - 06/01/21 0001       Lumbar Exercises: Stretches   Double Knee to Chest Stretch 2 reps;30 seconds    Lower Trunk Rotation 2 reps;60 seconds      Lumbar Exercises: Supine   Dead Bug 15 reps;2 seconds    Bridge 15 reps    Straight Leg Raise 15 reps                        PT Short Term Goals - 05/10/21 1437       PT SHORT TERM GOAL #1   Title Patient will be independent with HEP in order to improve functional outcomes.    Time 3    Period Weeks    Status On-going    Target Date 05/18/21      PT SHORT TERM GOAL #2   Title Patient will report at least 25% improvement in symptoms for improved quality of life.    Baseline able to stand x 10 minutes while washing dishes    Time 3    Period Weeks    Status On-going    Target Date 05/18/21      PT SHORT TERM GOAL #3   Title Demo 3/5 trunk flexion strength to enhance lumbar stabilization    Baseline 2+/5    Time 3    Period Weeks  Status On-going    Target Date 05/18/21               PT Long Term Goals - 05/10/21 1438       PT LONG TERM GOAL #1   Title Patient will report at least 50% improvement in symptoms for improved quality of life.    Time 6    Period Weeks    Status On-going      PT LONG TERM GOAL #2   Title Demo trunk strength 4/5 to facilitate improved lumbar stabilization during functional lifts to reduce risk for re-injury    Baseline 2+/5    Time 6    Period Weeks    Status On-going                   Plan - 06/01/21 1446     Clinical Impression Statement Continued with established core stabilization and stretches.  Encouraged pt to complete exercises at least 2X day as instructed to improve her outcomes.  Pt able to complete all exercises this session without any complaints of pain or noted challenge/difficulty.  Unable to complete full program as pt arrived late for appointment.    Personal Factors and Comorbidities Comorbidity 1;Time since onset of injury/illness/exacerbation;Past/Current Experience;Fitness    Comorbidities RA    Examination-Activity Limitations Bend;Carry;Lift;Stand;Stairs;Squat;Sit;Sleep;Locomotion Level;Reach Overhead;Transfers    Examination-Participation Restrictions Cleaning;Community Activity;Driving;Laundry;Yard Work;Shop;Meal Prep     Stability/Clinical Decision Making Stable/Uncomplicated    Rehab Potential Good    PT Frequency 2x / week    PT Duration 6 weeks    PT Treatment/Interventions ADLs/Self Care Home Management;DME Instruction;Traction;Gait training;Stair training;Functional mobility training;Therapeutic activities;Therapeutic exercise;Balance training;Patient/family education;Neuromuscular re-education;Manual techniques;Taping;Dry needling;Spinal Manipulations;Joint Manipulations    PT Next Visit Plan Lumbar stabliziation, flexion-bias    PT Home Exercise Plan PPT, hip flexion isometric (unilat/bilat), DKTC    Consulted and Agree with Plan of Care Patient             Patient will benefit from skilled therapeutic intervention in order to improve the following deficits and impairments:  Decreased activity tolerance, Decreased strength, Hypermobility, Difficulty walking, Postural dysfunction, Improper body mechanics, Pain, Obesity, Impaired perceived functional ability  Visit Diagnosis: Muscle weakness (generalized)  Other symptoms and signs involving the musculoskeletal system  Chronic low back pain, unspecified back pain laterality, unspecified whether sciatica present     Problem List Patient Active Problem List   Diagnosis Date Noted   Left carpal tunnel syndrome 05/29/2020   TFCC (triangular fibrocartilage complex) tear, left, initial encounter 05/29/2020   Cough variant asthma vs UACS 05/26/2020   Family history of breast cancer 12/17/2019   Vasomotor symptoms due to menopause 12/17/2019   Rheumatoid arthritis of multiple sites with negative rheumatoid factor ([place]) 09/30/2019   Hypertrophied anal papilla 02/08/2017   Migraines 01/10/2017   Vertigo 01/10/2017   Teena Irani, PTA/CLT, [facility] [phone]  Teena Irani, PTA 06/01/2021, 2:47 PM  [facility] [place], [place], [address] Phone: [phone]   Fax:  [phone]  Name:  Linda Cochran MRN: [id number] Date of Birth: 1976/04/11

## 2021-06-01 NOTE — Progress Notes (Signed)
Virtual Visit via Telephone Note   I connected with Linda Cochran on 06/01/21 at 11:00 AM EST by telephone and verified that I am speaking with the correct person using two identifiers.   Location: Patient: Home Provider: Office   I discussed the limitations, risks, security and privacy concerns of performing an evaluation and management service by telephone and the availability of in person appointments. I also discussed with the patient that there may be a patient responsible charge related to this service. The patient expressed understanding and agreed to proceed.     THERAPIST PROGRESS NOTE   Session Time: 10:00 AM-10:45 AM   Participation Level: Active   Behavioral Response: CasualAlertDepressed   Type of Therapy: Individual Therapy   Treatment Goals addressed: Anger and Coping   Interventions: CBT, Motivational Interviewing, Solution Focused and Strength-based   Summary: Linda Cochran is a 45 y.o. female who presents with Depression Anxiety and PTSD. The OPT therapist worked with the patient for her ongoing OPT treatment. The OPT therapist utilized Motivational Interviewing to assist in creating therapeutic repore. The patient in the session was engaged and work in collaboration giving feedback about her triggers and symptoms over the past few weeks. The patient who is in a unorthadox 2 partner relationship had the stressor of one of her partners being hospitalized due to a cardiac related episode.The patient spoke about her frustration in part from stress and changes this Christmas holiday and not being able to be around her kids on Christmas as they are going to be with there Father. The patient spoke about her [place] because she is going to be alone for the Christmas holiday and does not want to be around others who will have there kids around for the holiday. The patient spoke about her difficulty adjusting with her kids getting older and no longer  having the same connection and feeling alone as a single mom.The patient spoke about frustration with still being in a boot and having limited mobility due to pain and not being able to weight bear. The patient spoke about holiday related stress and family interactions. The patient is currently doing exercises at home for her foot but her recovery is not coming along as anticipated and she is uncertain about how her recovery will go and if she will need additional medical assistance. The patient spoke about the change in finances with losing her child support this Summer and being worried about if she will be physically in a place to return to work.The OPT therapist utilized Cognitive Behavioral Therapy through cognitive restructuring as well as worked with the patient on coping strategies to assist in management of mood. The OPT therapist worked in the session with the patient on challenging negative thoughts and implementing positive thinking and working within her limits stay healthy and active. The OPT therapist worked with the patient overiewing her basic health areas of sleep/eating/physical exercise/ and hygiene. The OPT therapist worked with the patient on adjusting to multiple transitions as well as health recovery.   Suicidal/Homicidal: Nowithout intent/plan   Therapist Response: The OPT therapist worked with the patient for the patients scheduled session. The patient was engaged in her session and gave feedback in relation to triggers, symptoms, and behavior responses over the past few weeks. The patient spoke about the impact of multiple external factors including her older children leaving the home and the patient is having difficulty adjusting to current and upcoming changes. The patient spoke  about her ongoing recovery from being in a air cast.The OPT therapist worked with the patient utilizing an in session Cognitive Behavioral Therapy exercise. The patient was responsive in the session and  verbalized, " I just wanted this Christmas to be a last chance to spend time with family and I planned this big vacation and things are just going to shit I have so many things I want to do with the kids but with my body right now I cant and its frustrating".  The OPT therapist worked with the patients on focusing on her own basic needs and health and things that are in her control.The OPT therapist worked with the patient about being mindful of her emotions and mood and doing self checkins throughout her week and implementing coping as needed to keep the patient at her mood baseline. The OPT therapist worked with the patient on self care focus.The OPT therapist reviewed the importance of continuing to be consistent with all recommendations and scheduled health appointments.   Plan: Return again in 2/3 weeks.   Diagnosis:      Axis I: Depression/Anxiety/and PTSD                          Axis II: No diagnosis   I discussed the assessment and treatment plan with the patient. The patient was provided an opportunity to ask questions and all were answered. The patient agreed with the plan and demonstrated an understanding of the instructions.   The patient was advised to call back or seek an in-person evaluation if the symptoms worsen or if the condition fails to improve as anticipated.   I provided 45 minutes of non-face-to-face time during this encounter.   Linda Cochran T. Eulas Post, Marlinda Mike   06/01/2021

## 2021-06-14 ENCOUNTER — Telehealth (HOSPITAL_COMMUNITY): Payer: Self-pay | Admitting: Physical Therapy

## 2021-06-14 ENCOUNTER — Ambulatory Visit (HOSPITAL_COMMUNITY): Payer: Medicaid Other | Admitting: Physical Therapy

## 2021-06-14 NOTE — Telephone Encounter (Signed)
The whold family is sick and she will call us about Thursday to let us know if she can make it or not.

## 2021-06-16 ENCOUNTER — Ambulatory Visit (HOSPITAL_COMMUNITY): Payer: Medicaid Other | Admitting: Physical Therapy

## 2021-06-29 ENCOUNTER — Ambulatory Visit (INDEPENDENT_AMBULATORY_CARE_PROVIDER_SITE_OTHER): Payer: Medicaid Other | Admitting: Clinical

## 2021-06-29 ENCOUNTER — Other Ambulatory Visit: Payer: Self-pay

## 2021-06-29 DIAGNOSIS — F419 Anxiety disorder, unspecified: Secondary | ICD-10-CM | POA: Diagnosis not present

## 2021-06-29 DIAGNOSIS — F4310 Post-traumatic stress disorder, unspecified: Secondary | ICD-10-CM | POA: Diagnosis not present

## 2021-06-29 DIAGNOSIS — F331 Major depressive disorder, recurrent, moderate: Secondary | ICD-10-CM

## 2021-06-29 NOTE — Progress Notes (Signed)
Virtual Visit via Telephone Note   I connected with Linda Cochran on 06/29/21 at 11:00 AM EST by telephone and verified that I am speaking with the correct person using two identifiers.   Location: Patient: Home Provider: Office   I discussed the limitations, risks, security and privacy concerns of performing an evaluation and management service by telephone and the availability of in person appointments. I also discussed with the patient that there may be a patient responsible charge related to this service. The patient expressed understanding and agreed to proceed.     THERAPIST PROGRESS NOTE   Session Time: 11:00 AM-11:45 AM   Participation Level: Active   Behavioral Response: CasualAlertDepressed   Type of Therapy: Individual Therapy   Treatment Goals addressed: Anger and Coping   Interventions: CBT, Motivational Interviewing, Solution Focused and Strength-based   Summary: Linda Cochran is a 46 y.o. female who presents with Depression Anxiety and PTSD. The OPT therapist worked with the patient for her ongoing OPT treatment. The OPT therapist utilized Motivational Interviewing to assist in creating therapeutic repore. The patient in the session was engaged and work in collaboration giving feedback about her triggers and symptoms over the past few weeks. The patient spoke about contracting COVID 19 around the beginning of 2023 and is currently struggling with recovering The patient spoke about  frustration over Christmas holiday. The patient spoke about her plans to move/relocate. The patient spoke about difficulty in trying to return to work while trying to get over [place] and managing her FM. The patient spoke about the change in finances with losing her child support this Summer.The OPT therapist utilized Cognitive Behavioral Therapy through cognitive restructuring as well as worked with the patient on coping strategies to assist in management of mood. The OPT therapist worked in the session  with the patient on challenging negative thoughts and implementing positive thinking and working within her limits stay healthy and active. The OPT therapist worked with the patient overiewing her basic health areas of sleep/eating/physical exercise/ and hygiene. The OPT therapist worked with the patient on adjusting to multiple transitions as well as health recovery.   Suicidal/Homicidal: Nowithout intent/plan   Therapist Response: The OPT therapist worked with the patient for the patients scheduled session. The patient was engaged in her session and gave feedback in relation to triggers, symptoms, and behavior responses over the past few weeks. The patient spoke about the impact of multiple external factors including contracting COVID. The patient spoke about her ongoing recovery going from a air cast to a brace currently.The OPT therapist worked with the patient utilizing an in session Cognitive Behavioral Therapy exercise. The patient was responsive in the session and verbalized, " I just wanted this Christmas to spend time with family and it went to shit I had so many things I want to do with the kids but they brought friends and it didn't work out".  The OPT therapist worked with the patients on focusing on her own basic needs and health and things that are in her control.The OPT therapist worked with the patient about being mindful of her emotions and mood and doing self checkins throughout her week and implementing coping as needed to keep the patient at her mood baseline. The OPT therapist worked with the patient on self care focus.The OPT therapist reviewed the importance of continuing to be consistent with all recommendations and scheduled health appointments.   Plan: Return again in 2/3 weeks.   Diagnosis:      Axis  I: Depression/Anxiety/and PTSD                          Axis II: No diagnosis   I discussed the assessment and treatment plan with the patient. The patient was provided an  opportunity to ask questions and all were answered. The patient agreed with the plan and demonstrated an understanding of the instructions.   The patient was advised to call back or seek an in-person evaluation if the symptoms worsen or if the condition fails to improve as anticipated.   I provided 45 minutes of non-face-to-face time during this encounter.   Linda Cochran, Linda Cochran   06/29/2021

## 2021-06-30 ENCOUNTER — Ambulatory Visit (HOSPITAL_COMMUNITY): Payer: Medicaid Other | Admitting: Physical Therapy

## 2021-07-12 ENCOUNTER — Other Ambulatory Visit: Payer: Self-pay | Admitting: Physician Assistant

## 2021-07-12 DIAGNOSIS — Z1231 Encounter for screening mammogram for malignant neoplasm of breast: Secondary | ICD-10-CM

## 2021-07-13 ENCOUNTER — Other Ambulatory Visit: Payer: Self-pay

## 2021-07-13 ENCOUNTER — Ambulatory Visit: Payer: Medicaid Other | Admitting: Allergy & Immunology

## 2021-07-13 VITALS — BP 114/80 | HR 97 | Temp 98.4°F | Resp 18 | Ht 65.5 in | Wt 193.0 lb

## 2021-07-13 DIAGNOSIS — J3089 Other allergic rhinitis: Secondary | ICD-10-CM

## 2021-07-13 DIAGNOSIS — J302 Other seasonal allergic rhinitis: Secondary | ICD-10-CM

## 2021-07-13 DIAGNOSIS — J310 Chronic rhinitis: Secondary | ICD-10-CM

## 2021-07-13 DIAGNOSIS — J4540 Moderate persistent asthma, uncomplicated: Secondary | ICD-10-CM | POA: Diagnosis not present

## 2021-07-13 MED ORDER — AEROCHAMBER PLUS FLO-VU LARGE MISC: 1.0000 | Freq: Once | 0 refills | Status: AC

## 2021-07-13 MED ORDER — CARBINOXAMINE MALEATE 4 MG PO TABS: 4.0000 mg | ORAL_TABLET | Freq: Three times a day (TID) | ORAL | 5 refills | Status: DC | PRN

## 2021-07-13 MED ORDER — TRIAMCINOLONE ACETONIDE 55 MCG/ACT NA AERO: 2.0000 | INHALATION_SPRAY | Freq: Every day | NASAL | 5 refills | Status: DC

## 2021-07-13 MED ORDER — AZELASTINE HCL 0.1 % NA SOLN: 2.0000 | Freq: Two times a day (BID) | NASAL | 5 refills | Status: DC

## 2021-07-13 MED ORDER — BUDESONIDE-FORMOTEROL FUMARATE 80-4.5 MCG/ACT IN AERO: 2.0000 | INHALATION_SPRAY | Freq: Two times a day (BID) | RESPIRATORY_TRACT | 5 refills | Status: DC

## 2021-07-13 NOTE — Patient Instructions (Signed)
1. Moderate persistent asthma, uncomplicated - Lung testing looked normal and there was some mild improvement with the albuterol treatment. - Because of your continued symptoms, I recommend starting a medication to help with your daily symptoms called Symbicort. - Symbicort contains a long acting albuterol combined with an inhaled steroid.  - Spacer use reviewed. - Daily controller medication(s): Symbicort 80/4.9mcg two puffs twice daily with spacer - Prior to physical activity: albuterol 2 puffs 10-15 minutes before physical activity. - Rescue medications: albuterol 4 puffs every 4-6 hours as needed - Asthma control goals:  * Full participation in all desired activities (may need albuterol before activity) * Albuterol use two time or less a week on average (not counting use with activity) * Cough interfering with sleep two time or less a month * Oral steroids no more than once a year * No hospitalizations  2. Chronic rhinitis - Testing today showed: grasses, ragweed, weeds, trees, indoor molds, outdoor molds, dust mites, cat, dog, and cockroach. - Copy of test results provided.  - Avoidance measures provided. - Continue taking: Singulair (montelukast) 10mg  daily - Stop taking: Benadryl like candy and Flonase - Start taking: carbinoxamine 4mg  every 8 hours as needed, Nasonex (mometasone) two sprays per nostril daily (AIM FOR EAR ON EACH SIDE), and Astelin (azelastine) 2 sprays per nostril 1-2 times daily as needed - You can use an extra dose of the antihistamine, if needed, for breakthrough symptoms.  - Consider nasal saline rinses 1-2 times daily to remove allergens from the nasal cavities as well as help with mucous clearance (this is especially helpful to do before the nasal sprays are given) - Consider allergy shots as a means of long-term control. - Allergy shots "re-train" and "reset" the immune system to ignore environmental allergens and decrease the resulting immune response to  those allergens (sneezing, itchy watery eyes, runny nose, nasal congestion, etc).    - Allergy shots improve symptoms in 75-85% of patients.  - We can discuss more at the next appointment if the medications are not working for you.  3. Return in about 6 weeks (around 08/24/2021).    Please inform us of any Emergency Department visits, hospitalizations, or changes in symptoms. Call us before going to the ED for breathing or allergy symptoms since we might be able to fit you in for a sick visit. Feel free to contact us anytime with any questions, problems, or concerns.  It was a pleasure to meet you today!  Websites that have reliable patient information: 1. American Academy of Asthma, Allergy, and Immunology: www.aaaai.org 2. Food Allergy Research and Education (FARE): foodallergy.org 3. Mothers of Asthmatics: http://www.asthmacommunitynetwork.org 4. American College of Allergy, Asthma, and Immunology: www.acaai.org   COVID-19 Vaccine Information can be found at: [email or web address] For questions related to vaccine distribution or appointments, please email [email or web address] or call [phone].   We realize that you might be concerned about having an allergic reaction to the COVID19 vaccines. To help with that concern, WE ARE OFFERING THE COVID19 VACCINES IN OUR OFFICE! Ask the front desk for dates!     Like [place] on [employer] and Instagram for our latest updates!      A healthy democracy works best when [employer] participate! Make sure you are registered to vote! If you have moved or changed any of your contact information, you will need to get this updated before voting!  In some cases, you MAY be able to register to vote online: [email or web address]      Airborne Adult  Perc - 07/13/21 1500     1. Control-Buffer 50% Glycerol Negative    2. Control-Histamine 1 mg/ml 2+    3. Albumin  saline Negative    4. [place]oll 3+    5. [place] 2+    6. Johnson 2+    7. [place] Blue 2+    8. Meadow Fescue 4+    9. Perennial Rye 4+    10. Sweet Vernal 2+    11. Timothy 3+    12. Cocklebur Negative    13. Burweed Marshelder Negative    14. Ragweed, short 3+    15. Ragweed, Giant 2+    16. Plantain,  English Negative    17. Lamb's Quarters Negative    18. Sheep Sorrell Negative    19. Rough Pigweed Negative    20. Marsh Elder, Rough 2+    21. Mugwort, Common Negative    22. Ash mix Negative    23. Birch mix 3+    24. Beech American 3+    25. Box, Elder Negative    26. Cedar, red 3+    27. Cottonwood, [place] Negative    28. Elm mix 2+    29. Hickory 3+    30. Maple mix 3+    31. Oak, [place] mix Negative    32. Pecan Pollen Negative    33. Pine mix Negative    34. Sycamore Eastern Negative    35. Marquette, Black Pollen Negative    36. Alternaria alternata 3+    37. Cladosporium Herbarum Negative    38. Aspergillus mix Negative    39. Penicillium mix Negative    40. Bipolaris sorokiniana (Helminthosporium) 2+    41. Drechslera spicifera (Curvularia) 2+    42. Mucor plumbeus Negative    43. Fusarium moniliforme Negative    44. Aureobasidium pullulans (pullulara) Negative    45. Rhizopus oryzae Negative    46. Botrytis cinera Negative    47. Epicoccum nigrum 2+    48. Phoma betae 2+    49. Candida Albicans Negative    50. Trichophyton mentagrophytes Negative    51. Mite, D Farinae  5,000 AU/ml Negative    52. Mite, D Pteronyssinus  5,000 AU/ml Negative    53. Cat Hair 10,000 BAU/ml 3+    54.  Dog Epithelia 2+    55. Mixed Feathers Negative    56. Horse Epithelia Negative    57. Cockroach, German Negative    58. Mouse Negative    59. Tobacco Leaf Negative             Intradermal - 07/13/21 1506     Time Antigen Placed 1515    Allergen Manufacturer Lavella Hammock    Location Arm    Number of Test 5    Intradermal Select    Control Negative    Mold 2 3+     Mold 4 4+    Cockroach 4+    Mite mix 4+             Reducing Pollen Exposure  The American Academy of Allergy, Asthma and Immunology suggests the following steps to reduce your exposure to pollen during allergy seasons.    Do not hang sheets or clothing out to dry; pollen may collect on these items. Do not mow lawns or spend time around freshly cut grass; mowing stirs up pollen. Keep windows closed at night.  Keep car windows closed while driving. Minimize morning activities outdoors, a time when pollen counts are  usually at their highest. Stay indoors as much as possible when pollen counts or humidity is high and on windy days when pollen tends to remain in the air longer. Use air conditioning when possible.  Many air conditioners have filters that trap the pollen spores. Use a HEPA room air filter to remove pollen form the indoor air you breathe.  Control of Mold Allergen   Mold and fungi can grow on a variety of surfaces provided certain temperature and moisture conditions exist.  Outdoor molds grow on plants, decaying vegetation and soil.  The major outdoor mold, Alternaria and Cladosporium, are found in very high numbers during hot and dry conditions.  Generally, a late Summer - Fall peak is seen for common outdoor fungal spores.  Rain will temporarily lower outdoor mold spore count, but counts rise rapidly when the rainy period ends.  The most important indoor molds are Aspergillus and Penicillium.  Dark, humid and poorly ventilated basements are ideal sites for mold growth.  The next most common sites of mold growth are the bathroom and the kitchen.  Outdoor (Seasonal) Mold Control  Positive outdoor molds via skin testing: Alternaria, Bipolaris (Helminthsporium), Drechslera (Curvalaria), and Epicoccum  Use air conditioning and keep windows closed Avoid exposure to decaying vegetation. Avoid leaf raking. Avoid grain handling. Consider wearing a face mask if working in moldy  areas.   Indoor (Perennial) Mold Control   Positive indoor molds via skin testing: Aspergillus, Penicillium, Fusarium, Aureobasidium (Pullulara), Rhizopus, and Phoma  Maintain humidity below 50%. Clean washable surfaces with 5% bleach solution. Remove sources e.g. contaminated carpets.    Control of Dust Mite Allergen    Dust mites play a major role in allergic asthma and rhinitis.  They occur in environments with high humidity wherever human skin is found.  Dust mites absorb humidity from the atmosphere (ie, they do not drink) and feed on organic matter (including shed human and animal skin).  Dust mites are a microscopic type of insect that you cannot see with the naked eye.  High levels of dust mites have been detected from mattresses, pillows, carpets, upholstered furniture, bed covers, clothes, soft toys and any woven material.  The principal allergen of the dust mite is found in its feces.  A gram of dust may contain 1,000 mites and 250,000 fecal particles.  Mite antigen is easily measured in the air during house cleaning activities.  Dust mites do not bite and do not cause harm to humans, other than by triggering allergies/asthma.    Ways to decrease your exposure to dust mites in your home:  Encase mattresses, box springs and pillows with a mite-impermeable barrier or cover   Wash sheets, blankets and drapes weekly in hot water (130 F) with detergent and dry them in a dryer on the hot setting.  Have the room cleaned frequently with a vacuum cleaner and a damp dust-mop.  For carpeting or rugs, vacuuming with a vacuum cleaner equipped with a high-efficiency particulate air (HEPA) filter.  The dust mite allergic individual should not be in a room which is being cleaned and should wait 1 hour after cleaning before going into the room. Do not sleep on upholstered furniture (eg, couches).   If possible removing carpeting, upholstered furniture and drapery from the home is ideal.  Horizontal  blinds should be eliminated in the rooms where the person spends the most time (bedroom, study, television room).  Washable vinyl, roller-type shades are optimal. Remove all non-washable stuffed toys from the  bedroom.  Wash stuffed toys weekly like sheets and blankets above.   Reduce indoor humidity to less than 50%.  Inexpensive humidity monitors can be purchased at most hardware stores.  Do not use a humidifier as can make the problem worse and are not recommended.  Control of Dog or Cat Allergen  Avoidance is the best way to manage a dog or cat allergy. If you have a dog or cat and are allergic to dog or cats, consider removing the dog or cat from the home. If you have a dog or cat but dont want to find it a new home, or if your family wants a pet even though someone in the household is allergic, here are some strategies that may help keep symptoms at bay:  Keep the pet out of your bedroom and restrict it to only a few rooms. Be advised that keeping the dog or cat in only one room will not limit the allergens to that room. Dont pet, hug or kiss the dog or cat; if you do, wash your hands with soap and water. High-efficiency particulate air (HEPA) cleaners run continuously in a bedroom or living room can reduce allergen levels over time. Regular use of a high-efficiency vacuum cleaner or a central vacuum can reduce allergen levels. Giving your dog or cat a bath at least once a week can reduce airborne allergen.  Control of Cockroach Allergen  Cockroach allergen has been identified as an important cause of acute attacks of asthma, especially in urban settings.  There are fifty-five species of cockroach that exist in the [place], however only three, the [place], [place] species produce allergen that can affect patients with Asthma.  Allergens can be obtained from fecal particles, egg casings and secretions from cockroaches.    Remove food sources. Reduce access to water. Seal  access and entry points. Spray runways with 0.5-1% Diazinon or Chlorpyrifos Blow boric acid power under stoves and refrigerator. Place bait stations (hydramethylnon) at feeding sites.  Allergy Shots   Allergies are the result of a chain reaction that starts in the immune system. Your immune system controls how your body defends itself. For instance, if you have an allergy to pollen, your immune system identifies pollen as an invader or allergen. Your immune system overreacts by producing antibodies called Immunoglobulin E (IgE). These antibodies travel to cells that release chemicals, causing an allergic reaction.  The concept behind allergy immunotherapy, whether it is received in the form of shots or tablets, is that the immune system can be desensitized to specific allergens that trigger allergy symptoms. Although it requires time and patience, the payback can be long-term relief.  How Do Allergy Shots Work?  Allergy shots work much like a vaccine. Your body responds to injected amounts of a particular allergen given in increasing doses, eventually developing a resistance and tolerance to it. Allergy shots can lead to decreased, minimal or no allergy symptoms.  There generally are two phases: build-up and maintenance. Build-up often ranges from three to six months and involves receiving injections with increasing amounts of the allergens. The shots are typically given once or twice a week, though more rapid build-up schedules are sometimes used.  The maintenance phase begins when the most effective dose is reached. This dose is different for each person, depending on how allergic you are and your response to the build-up injections. Once the maintenance dose is reached, there are longer periods between injections, typically two to four weeks.  Occasionally  doctors give cortisone-type shots that can temporarily reduce allergy symptoms. These types of shots are different and should not be  confused with allergy immunotherapy shots.  Who Can Be Treated with Allergy Shots?  Allergy shots may be a good treatment approach for people with allergic rhinitis (hay fever), allergic asthma, conjunctivitis (eye allergy) or stinging insect allergy.   Before deciding to begin allergy shots, you should consider:   The length of allergy season and the severity of your symptoms  Whether medications and/or changes to your environment can control your symptoms  Your desire to avoid long-term medication use  Time: allergy immunotherapy requires a major time commitment  Cost: may vary depending on your insurance coverage  Allergy shots for children age 37 and older are effective and often well tolerated. They might prevent the onset of new allergen sensitivities or the progression to asthma.  Allergy shots are not started on patients who are pregnant but can be continued on patients who become pregnant while receiving them. In some patients with other medical conditions or who take certain common medications, allergy shots may be of risk. It is important to mention other medications you talk to your allergist.   When Will I Feel Better?  Some may experience decreased allergy symptoms during the build-up phase. For others, it may take as long as 12 months on the maintenance dose. If there is no improvement after a year of maintenance, your allergist will discuss other treatment options with you.  If you arent responding to allergy shots, it may be because there is not enough dose of the allergen in your vaccine or there are missing allergens that were not identified during your allergy testing. Other reasons could be that there are high levels of the allergen in your environment or major exposure to non-allergic triggers like tobacco smoke.  What Is the Length of Treatment?  Once the maintenance dose is reached, allergy shots are generally continued for three to five years. The decision to  stop should be discussed with your allergist at that time. Some people may experience a permanent reduction of allergy symptoms. Others may relapse and a longer course of allergy shots can be considered.  What Are the Possible Reactions?  The two types of adverse reactions that can occur with allergy shots are local and systemic. Common local reactions include very mild redness and swelling at the injection site, which can happen immediately or several hours after. A systemic reaction, which is less common, affects the entire body or a particular body system. They are usually mild and typically respond quickly to medications. Signs include increased allergy symptoms such as sneezing, a stuffy nose or hives.  Rarely, a serious systemic reaction called anaphylaxis can develop. Symptoms include swelling in the throat, wheezing, a feeling of tightness in the chest, nausea or dizziness. Most serious systemic reactions develop within 30 minutes of allergy shots. This is why it is strongly recommended you wait in your doctors office for 30 minutes after your injections. Your allergist is trained to watch for reactions, and his or her staff is trained and equipped with the proper medications to identify and treat them.  Who Should Administer Allergy Shots?  The preferred location for receiving shots is your prescribing allergists office. Injections can sometimes be given at another facility where the physician and staff are trained to recognize and treat reactions, and have received instructions by your prescribing allergist.

## 2021-07-13 NOTE — Progress Notes (Signed)
NEW PATIENT  Date of Service/Encounter:  07/13/21  Consult requested by: Nicholes Rough, PA-C   Assessment:   Moderate persistent asthma, uncomplicated  Seasonal and perennial allergic rhinitis (grasses, ragweed, weeds, trees, indoor molds, outdoor molds, dust mites, cat, dog, and cockroach)  Plan/Recommendations:   1. Moderate persistent asthma, uncomplicated - Lung testing looked normal and there was some mild improvement with the albuterol treatment. - Because of your continued symptoms, I recommend starting a medication to help with your daily symptoms called Symbicort. - Symbicort contains a long acting albuterol combined with an inhaled steroid.  - Spacer use reviewed. - Daily controller medication(s): Symbicort 80/4.95mcg two puffs twice daily with spacer - Prior to physical activity: albuterol 2 puffs 10-15 minutes before physical activity. - Rescue medications: albuterol 4 puffs every 4-6 hours as needed - Asthma control goals:  * Full participation in all desired activities (may need albuterol before activity) * Albuterol use two time or less a week on average (not counting use with activity) * Cough interfering with sleep two time or less a month * Oral steroids no more than once a year * No hospitalizations  2. Chronic rhinitis - Testing today showed: grasses, ragweed, weeds, trees, indoor molds, outdoor molds, dust mites, cat, dog, and cockroach. - Copy of test results provided.  - Avoidance measures provided. - Continue taking: Singulair (montelukast) 10mg  daily - Stop taking: Benadryl like candy and Flonase - Start taking: carbinoxamine 4mg  every 8 hours as needed, Nasonex (mometasone) two sprays per nostril daily (AIM FOR EAR ON EACH SIDE), and Astelin (azelastine) 2 sprays per nostril 1-2 times daily as needed - You can use an extra dose of the antihistamine, if needed, for breakthrough symptoms.  - Consider nasal saline rinses 1-2 times daily to remove  allergens from the nasal cavities as well as help with mucous clearance (this is especially helpful to do before the nasal sprays are given) - Consider allergy shots as a means of long-term control. - Allergy shots "re-train" and "reset" the immune system to ignore environmental allergens and decrease the resulting immune response to those allergens (sneezing, itchy watery eyes, runny nose, nasal congestion, etc).    - Allergy shots improve symptoms in 75-85% of patients.  - We can discuss more at the next appointment if the medications are not working for you.  3. Return in about 6 weeks (around 08/24/2021).    This note in its entirety was forwarded to the Provider who requested this consultation.  Subjective:   Linda Cochran is a 46 y.o. female presenting today for evaluation of  Chief Complaint  Patient presents with   Cough    Pulmonologist sent her due to labs being off and has issues with a tickle in her throat and mucus.    Allergy Testing    Specialist sent her for testing due to abnormal labs.     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  Linda Cochran was referred by Loyal Buba,  Sheri, PA-C.     Linda Cochran is a 46 y.o. female presenting for an evaluation of environmental allergies contributing to her breathing issues .   All of her symptoms seem to have started after she had COVID in January 2020.  Her asthma has been out of control since that time.   She had asthma before this as a child, but did not have many issues for a decade or more.  She is vaccinated and boosted, but has not received her by valent booster.   Asthma/Respiratory Symptom History: She has actually been followed by Dr. Melvyn Novas for this.  He has had her only on ProAir and Singulair.  Singulair was added by her PCP.  He has never tried daily controller medication.  It seems that he has been aggressively treating reflux.  It is unclear whether this is helped. Since contracting COVID-19 in January 2020, she has had shortness of breath and has not been able to do as much with regards to her activities of daily living.  This includes anything as simple as walking up and down steps.  Her symptoms are definitely worse at night.  Allergic Rhinitis Symptom History: She has used Flonase in the past which causes epistaxis.  She has not tried any other nose sprays. She has never been allergy tested in the past.  She reports that she gets sinusitis 1-2 times per year.  Symptoms seem to have gotten better when she moved to a new house around 4 years ago.  Spring is the worst time of the year for her and she reports today that she can eat 15 Benadryl in a day.  She reports quite a bit of congestion.  She reports that she has a lot of mucus production which leads to a globus sensation.  She is constantly spitting up mucus and reports a clogged nose.  Skin Symptom History: She reports hives with current medications including opioids and penicillin.  She has no history of eczema.   Otherwise, there is no history of other atopic diseases, including drug allergies, stinging insect allergies, eczema, urticaria, or contact dermatitis. There is no significant infectious history. Vaccinations are up to date.    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 If you have any questions, ask your nurse or doctor.          STOP taking these medications    etodolac 400 MG tablet Commonly known as: LODINE Stopped by: Valentina Shaggy, MD   famotidine 20 MG tablet Commonly known as:  Pepcid Stopped by: Valentina Shaggy, MD       TAKE these medications    AeroChamber Plus Flo-Vu Large Misc 1 each by Other route once for 1 dose. Started by: Valentina Shaggy, MD   albuterol 108 (90 Base) MCG/ACT inhaler Commonly known as: VENTOLIN HFA Inhale into the lungs.   amitriptyline 50 MG tablet Commonly known as: ELAVIL TAKE 1 TABLET BY MOUTH EVERYDAY AT BEDTIME What changed: how much to take   azelastine 0.1 % nasal spray Commonly known as: ASTELIN Place 2 sprays into both nostrils 2 (two) times daily. Use in each nostril as directed Started by: Valentina Shaggy, MD   budesonide-formoterol 80-4.5 MCG/ACT inhaler Commonly known as: Symbicort Inhale 2 puffs into the lungs in the morning and at bedtime. Started by: Valentina Shaggy, MD   Carbinoxamine Maleate 4 MG Tabs Take 1 tablet (4 mg total) by mouth every 8 (eight) hours as needed. Started by: Valentina Shaggy, MD    cyclobenzaprine 10 MG tablet Commonly known as: FLEXERIL Take 10 mg by mouth in the morning and at bedtime.   eletriptan 40 MG tablet Commonly known as: RELPAX TAKE 1 TABLET BY MOUTH AS NEEDED. MAY REPEAT IN 2 HOURS IF NECESSARY. NO MORE THAN 2 IN 24 HOURS.   gabapentin 300 MG capsule Commonly known as: NEURONTIN Take by mouth.   meclizine 25 MG tablet Commonly known as: ANTIVERT Take 25 mg by mouth 3 (three) times daily as needed for dizziness.   meloxicam 7.5 MG tablet Commonly known as: MOBIC Take by mouth.   montelukast 10 MG tablet Commonly known as: SINGULAIR Take by mouth.   omeprazole 40 MG capsule Commonly known as: PRILOSEC Take 1 capsule (40 mg total) by mouth daily.   ondansetron 4 MG/5ML solution Commonly known as: ZOFRAN   phentermine 37.5 MG tablet Commonly known as: ADIPEX-P Take by mouth.   triamcinolone 55 MCG/ACT Aero nasal inhaler Commonly known as: NASACORT Place 2 sprays into the nose daily. Started by: Valentina Shaggy, MD        Birth History: non-contributory  Developmental History: non-contributory  Past Surgical History: Past Surgical History:  Procedure Laterality Date   ABDOMINAL HYSTERECTOMY     CESAREAN SECTION  2005   CHOLECYSTECTOMY  2007   COLONOSCOPY  2019   CYST REMOVAL NECK  1995   laproscopy  2005   uterine prolapse repair       Family History: Family History  Problem Relation Age of Onset   Kidney failure Mother    Hypertension Mother    Migraines Mother    Skin cancer Mother    Cervical cancer Mother    Diabetes Mother    Hypertension Father    Diabetes Father    Breast cancer Paternal Aunt    Brain cancer Paternal Aunt    Uterine cancer Maternal Grandmother    Kidney failure Maternal Grandmother    Colon cancer Maternal Grandfather    Skin cancer Maternal Grandfather    Diabetes Maternal Grandfather    Kidney failure Paternal Grandfather      Social History: Marvena lives at home with her  family.  They live in a mobile home that was built in 1981.  There is wood laminate throughout the home.  They have gas and electric heating as well as central cooling.  There are 2 cats outside of the home.  There are no dust mite covers on the bedding.  There is tobacco exposure in the  car, but not by the patient.  There is no HEPA filter in the home.  They do not live near an interstate or industrial area.   Review of Systems  Constitutional: Negative.  Negative for chills, fever, malaise/fatigue and weight loss.  HENT:  Positive for congestion and sinus pain. Negative for ear discharge and ear pain.        Positive for globus sensation.   Eyes:  Negative for pain, discharge and redness.  Respiratory:  Positive for cough. Negative for sputum production, shortness of breath and wheezing.   Cardiovascular: Negative.  Negative for chest pain and palpitations.  Gastrointestinal:  Negative for abdominal pain, diarrhea, heartburn, nausea and vomiting.  Skin: Negative.  Negative for itching and rash.  Neurological:  Negative for dizziness and headaches.  Endo/Heme/Allergies:  Positive for environmental allergies. Does not bruise/bleed easily.      Objective:   Blood pressure 114/80, pulse 97, temperature 98.4 F (36.9 C), temperature source Temporal, resp. rate 18, height 5' 5.5" (1.664 m), weight 193 lb (87.5 kg), SpO2 99 %. Body mass index is 31.63 kg/m.   Physical Exam:   Physical Exam Vitals reviewed.  Constitutional:      Appearance: She is well-developed.  HENT:     Head: Normocephalic and atraumatic.     Right Ear: Tympanic membrane, ear canal and external ear normal. No drainage, swelling or tenderness. Tympanic membrane is not injected, scarred, erythematous, retracted or bulging.     Left Ear: Tympanic membrane, ear canal and external ear normal. No drainage, swelling or tenderness. Tympanic membrane is not injected, scarred, erythematous, retracted or bulging.     Nose: No  nasal deformity, septal deviation, mucosal edema or rhinorrhea.     Right Turbinates: Enlarged, swollen and pale.     Left Turbinates: Enlarged, swollen and pale.     Right Sinus: No maxillary sinus tenderness or frontal sinus tenderness.     Left Sinus: No maxillary sinus tenderness or frontal sinus tenderness.     Mouth/Throat:     Mouth: Mucous membranes are not pale and not dry.     Pharynx: Uvula midline.  Eyes:     General:        Right eye: No discharge.        Left eye: No discharge.     Conjunctiva/sclera: Conjunctivae normal.     Right eye: Right conjunctiva is not injected. No chemosis.    Left eye: Left conjunctiva is not injected. No chemosis.    Pupils: Pupils are equal, round, and reactive to light.  Cardiovascular:     Rate and Rhythm: Normal rate and regular rhythm.     Heart sounds: Normal heart sounds.  Pulmonary:     Effort: Pulmonary effort is normal. No tachypnea, accessory muscle usage or respiratory distress.     Breath sounds: Normal breath sounds. No wheezing, rhonchi or rales.     Comments: Moving air well in all lung fields. No increased work of breathing noted.  Chest:     Chest wall: No tenderness.  Abdominal:     Tenderness: There is no abdominal tenderness. There is no guarding or rebound.  Lymphadenopathy:     Head:     Right side of head: No submandibular, tonsillar or occipital adenopathy.     Left side of head: No submandibular, tonsillar or occipital adenopathy.     Cervical: No cervical adenopathy.  Skin:    General: Skin is warm.     Capillary Refill: Capillary  refill takes less than 2 seconds.     Coloration: Skin is not pale.     Findings: No abrasion, erythema, petechiae or rash. Rash is not papular, urticarial or vesicular.  Neurological:     Mental Status: She is alert.  Psychiatric:        Behavior: Behavior is cooperative.     Diagnostic studies:   Spirometry: results normal (FEV1: 2.36/79%, FVC: 2.93/79%, FEV1/FVC: 81%).     Spirometry consistent with normal pattern. Xopenex four puffs via MDI treatment given in clinic with  improvement in the FEF25-75%, but otherwise no changes at all.   Allergy Studies:    Airborne Adult Perc - 07/13/21 1500     1. Control-Buffer 50% Glycerol Negative    2. Control-Histamine 1 mg/ml 2+    3. Albumin saline Negative    4. Eunice 3+    5. [place] 2+    6. Johnson 2+    7. [place] Blue 2+    8. Meadow Fescue 4+    9. Perennial Rye 4+    10. Sweet Vernal 2+    11. Timothy 3+    12. Cocklebur Negative    13. Burweed Marshelder Negative    14. Ragweed, short 3+    15. Ragweed, Giant 2+    16. Plantain,  English Negative    17. Lamb's Quarters Negative    18. Sheep Sorrell Negative    19. Rough Pigweed Negative    20. Marsh Elder, Rough 2+    21. Mugwort, Common Negative    22. Ash mix Negative    23. Birch mix 3+    24. Beech American 3+    25. Box, Elder Negative    26. Cedar, red 3+    27. Cottonwood, [place] Negative    28. Elm mix 2+    29. Hickory 3+    30. Maple mix 3+    31. Oak, [place] mix Negative    32. Pecan Pollen Negative    33. Pine mix Negative    34. Sycamore Eastern Negative    35. Hallam, Black Pollen Negative    36. Alternaria alternata 3+    37. Cladosporium Herbarum Negative    38. Aspergillus mix Negative    39. Penicillium mix Negative    40. Bipolaris sorokiniana (Helminthosporium) 2+    41. Drechslera spicifera (Curvularia) 2+    42. Mucor plumbeus Negative    43. Fusarium moniliforme Negative    44. Aureobasidium pullulans (pullulara) Negative    45. Rhizopus oryzae Negative    46. Botrytis cinera Negative    47. Epicoccum nigrum 2+    48. Phoma betae 2+    49. Candida Albicans Negative    50. Trichophyton mentagrophytes Negative    51. Mite, D Farinae  5,000 AU/ml Negative    52. Mite, D Pteronyssinus  5,000 AU/ml Negative    53. Cat Hair 10,000 BAU/ml 3+    54.  Dog Epithelia 2+    55. Mixed Feathers Negative    56.  Horse Epithelia Negative    57. Cockroach, German Negative    58. Mouse Negative    59. Tobacco Leaf Negative             Intradermal - 07/13/21 1506     Time Antigen Placed 1515    Allergen Manufacturer Lavella Hammock    Location Arm    Number of Test 5    Intradermal Select    Control Negative  Mold 2 3+    Mold 4 4+    Cockroach 4+    Mite mix 4+             Allergy testing results were read and interpreted by myself, documented by clinical staff.         Salvatore Marvel, MD [facility]

## 2021-07-15 ENCOUNTER — Encounter: Payer: Self-pay | Admitting: Allergy & Immunology

## 2021-07-16 ENCOUNTER — Other Ambulatory Visit: Payer: Self-pay | Admitting: Physician Assistant

## 2021-07-16 DIAGNOSIS — N644 Mastodynia: Secondary | ICD-10-CM

## 2021-07-20 ENCOUNTER — Ambulatory Visit (HOSPITAL_COMMUNITY): Payer: Medicaid Other | Admitting: Clinical

## 2021-07-26 ENCOUNTER — Other Ambulatory Visit: Payer: Self-pay

## 2021-07-26 ENCOUNTER — Ambulatory Visit (INDEPENDENT_AMBULATORY_CARE_PROVIDER_SITE_OTHER): Payer: Medicaid Other | Admitting: Clinical

## 2021-07-26 DIAGNOSIS — F4310 Post-traumatic stress disorder, unspecified: Secondary | ICD-10-CM | POA: Diagnosis not present

## 2021-07-26 DIAGNOSIS — F331 Major depressive disorder, recurrent, moderate: Secondary | ICD-10-CM

## 2021-07-26 DIAGNOSIS — F419 Anxiety disorder, unspecified: Secondary | ICD-10-CM

## 2021-07-26 NOTE — Progress Notes (Signed)
Virtual Visit via Telephone Note   I connected with Linda Cochran on 07/26/21 at 11:00 AM EST by telephone and verified that I am speaking with the correct person using two identifiers.   Location: Patient: Home Provider: Office   I discussed the limitations, risks, security and privacy concerns of performing an evaluation and management service by telephone and the availability of in person appointments. I also discussed with the patient that there may be a patient responsible charge related to this service. The patient expressed understanding and agreed to proceed.     THERAPIST PROGRESS NOTE   Session Time: 11:00 AM-11:30 AM   Participation Level: Active   Behavioral Response: CasualAlertDepressed   Type of Therapy: Individual Therapy   Treatment Goals addressed: Anger and Coping   Interventions: CBT, Motivational Interviewing, Solution Focused and Strength-based   Summary: Linda Cochran is a 46 y.o. female who presents with Depression Anxiety and PTSD. The OPT therapist worked with the patient for her ongoing OPT treatment. The OPT therapist utilized Motivational Interviewing to assist in creating therapeutic repore. The patient in the session was engaged and work in collaboration giving feedback about her triggers and symptoms over the past few weeks. The patient spoke about contracting COVID 19 around the beginning of 2023 and then recently once recovering got the flu from her kids and the patient is currently struggling with recovering currently from the flu. The patient spoke about the change in finances with losing her child support this Summer.The OPT therapist utilized Cognitive Behavioral Therapy through cognitive restructuring as well as worked with the patient on coping strategies to assist in management of mood. The OPT therapist worked in the session with the patient on challenging negative thoughts and implementing positive thinking and working within her limits stay healthy and  active. The OPT therapist worked with the patient overiewing her basic health areas of sleep/eating/physical exercise/ and hygiene.    Suicidal/Homicidal: Nowithout intent/plan   Therapist Response: The OPT therapist worked with the patient for the patients scheduled session. The patient was engaged in her session and gave feedback in relation to triggers, symptoms, and behavior responses over the past few weeks. The patient spoke about the impact of multiple external factors including contracting flu from her kids.The OPT therapist worked with the patient utilizing an in session Cognitive Behavioral Therapy exercise. The patient was responsive and spoke about the rush to get her physical health appointments addressed while she has active insurance before the child support/insurance coverage which is due to change when her kids turn 70 in the upcoming Summer.  The OPT therapist worked with the patients on focusing on her own basic needs and health and things that are in her control.The OPT therapist worked with the patient about being mindful of her emotions and mood and doing self checkins throughout her week and implementing coping as needed to keep the patient at her mood baseline. The OPT therapist worked with the patient on self care focus.The OPT therapist reviewed the importance of continuing to be consistent with all recommendations and scheduled health appointments.The patient spoke about the multiple changes she is going through and will be going through in the upcoming Summer. The patient spoke about her current plans to move and she is going to be moving in with friends.   Plan: Return again in 2/3 weeks.   Diagnosis:      Axis I: Depression/Anxiety/and PTSD  Axis II: No diagnosis   I discussed the assessment and treatment plan with the patient. The patient was provided an opportunity to ask questions and all were answered. The patient agreed with the plan and  demonstrated an understanding of the instructions.   The patient was advised to call back or seek an in-person evaluation if the symptoms worsen or if the condition fails to improve as anticipated.   I provided 30 minutes of non-face-to-face time during this encounter.   Linda Cochran Linda Cochran, Linda Cochran   07/26/2021

## 2021-08-03 ENCOUNTER — Ambulatory Visit
Admission: RE | Admit: 2021-08-03 | Discharge: 2021-08-03 | Disposition: A | Discharge status: Home / Self Care | Payer: Medicaid Other | Source: Ambulatory Visit | Attending: Physician Assistant | Admitting: Physician Assistant

## 2021-08-03 DIAGNOSIS — N644 Mastodynia: Secondary | ICD-10-CM

## 2021-08-08 ENCOUNTER — Telehealth: Payer: Self-pay

## 2021-08-08 ENCOUNTER — Ambulatory Visit (INDEPENDENT_AMBULATORY_CARE_PROVIDER_SITE_OTHER): Payer: Medicaid Other | Admitting: Family Medicine

## 2021-08-08 ENCOUNTER — Encounter: Payer: Self-pay | Admitting: Family Medicine

## 2021-08-08 ENCOUNTER — Other Ambulatory Visit: Payer: Self-pay

## 2021-08-08 DIAGNOSIS — J302 Other seasonal allergic rhinitis: Secondary | ICD-10-CM | POA: Insufficient documentation

## 2021-08-08 DIAGNOSIS — K219 Gastro-esophageal reflux disease without esophagitis: Secondary | ICD-10-CM

## 2021-08-08 DIAGNOSIS — J0100 Acute maxillary sinusitis, unspecified: Secondary | ICD-10-CM | POA: Diagnosis not present

## 2021-08-08 DIAGNOSIS — J4540 Moderate persistent asthma, uncomplicated: Secondary | ICD-10-CM | POA: Insufficient documentation

## 2021-08-08 DIAGNOSIS — J3089 Other allergic rhinitis: Secondary | ICD-10-CM | POA: Diagnosis not present

## 2021-08-08 MED ORDER — DOXYCYCLINE HYCLATE 100 MG PO TABS: 100.0000 mg | ORAL_TABLET | Freq: Two times a day (BID) | ORAL | 0 refills | Status: DC

## 2021-08-08 NOTE — Progress Notes (Signed)
RE: Linda Cochran MRN: [id number] DOB: [birth year] Date of Telemedicine Visit: 08/08/2021  Referring provider: Nicholes Rough, PA-C Primary care provider: Nicholes Rough, PA-C  Chief Complaint: Asthma (2 weeks ago her son was positive to flu and she got sick a few days later. Had congestion, feel bad, coughing, body aches, fever. She can not get better and believes he has a sinus infection. She is coughing up green drainage and throwing it up also. Voice is horse she can smell the drainage )   Telemedicine Follow Up Visit via Telephone: I connected with Linda Cochran for a follow up on 08/08/21 by telephone and verified that I am speaking with the correct person using two identifiers.   I discussed the limitations, risks, security and privacy concerns of performing an evaluation and management service by telephone and the availability of in person appointments. I also discussed with the patient that there may be a patient responsible charge related to this service. The patient expressed understanding and agreed to proceed.  Patient is at home  Provider is at the office.  Visit start time: 304 Visit end time: 4 Insurance consent/check in by: [employer] consent and medical assistant/nurse: Morey Hummingbird  History of Present Illness: She is a 46 y.o. female, who is being followed for asthma, allergic rhinitis, and reflux. Her previous allergy office visit was on 07/13/2021 with Dr. Ernst Bowler.  In the interim, she reports that on 07/14/2021 her son was diagnosed with influenza.  Shortly after, Linda Cochran was diagnosed with influenza and experienced symptoms including body aches, fever, and nasal symptoms.  At today's visit, she reports that she has had green nasal drainage, increasing nasal congestion, and pain underneath both eyes for about 1 week.  She continues carbinoxamine tablets 4 mg once every 8 hours with no relief of symptoms.  She is not currently using a nasal saline rinse.  She reports a few incidences  involving posttussive vomiting.  She reports her asthma has been moderately well controlled with some shortness of breath that is improving, no wheeze, and occasional cough producing green thick sputum.  She continues Symbicort 80-2 puffs twice a day with a spacer, and has used albuterol 2 times on Saturday, 2 times on Sunday, and 1 time today with relief of symptoms.  She reports that she generally gets 1 sinus infection a year requiring antibiotics.  Her current medications are listed in the chart.  Assessment and Plan: Kamla is a 46 y.o. female with: Patient Instructions  Acute sinusitis Begin doxycycline 100 mg twice a day for 10 days Begin nasal saline rinses several times a day Stop carbinoxamine for the next 5 to 7 days if you can tolerate this Begin Mucinex 600 to 1200 mg twice a day and increase fluid intake in order to to thin mucus Call the clinic if your symptoms worsen, do not improve, or if you develop a fever  Asthma Continue Symbicort 80-2 puffs twice a day with a spacer to prevent cough or wheeze Continue albuterol 2 puffs once every 4 hours as needed for cough or wheeze You may use albuterol 2 puffs 5 to 15 minutes before activity to decrease cough or wheeze  Allergic rhinitis Continue allergen avoidance measures directed toward grass pollen, weed pollen, tree pollen, mold, cat, dog, dust mite, and cockroach as listed below Continue Nasacort 2 sprays in each nostril once a day as needed for stuffy nose Consider saline nasal rinses as needed for nasal symptoms. Use this before any medicated nasal sprays for  best result After your nasal congestion resolves, continue carbinoxamine as needed for a runny nose or itch  Reflux Continue dietary and lifestyle modifications as listed below Continue omeprazole as previously prescribed to prevent reflux  Call the clinic if this treatment plan is not working well for you.  Follow up in 1 month or sooner if needed.    Return in  about 4 weeks (around 09/05/2021), or if symptoms worsen or fail to improve.  Meds ordered this encounter  Medications   doxycycline (VIBRA-TABS) 100 MG tablet    Sig: Take 1 tablet (100 mg total) by mouth 2 (two) times daily.    Dispense:  20 tablet    Refill:  0    Medication List:  Current Outpatient Medications  Medication Sig Dispense Refill   albuterol (VENTOLIN HFA) 108 (90 Base) MCG/ACT inhaler Inhale into the lungs.     amitriptyline (ELAVIL) 50 MG tablet TAKE 1 TABLET BY MOUTH EVERYDAY AT BEDTIME (Patient taking differently: 25 mg. TAKE 1 TABLET BY MOUTH EVERYDAY AT BEDTIME) 90 tablet 0   azelastine (ASTELIN) 0.1 % nasal spray Place 2 sprays into both nostrils 2 (two) times daily. Use in each nostril as directed 30 mL 5   budesonide-formoterol (SYMBICORT) 80-4.5 MCG/ACT inhaler Inhale 2 puffs into the lungs in the morning and at bedtime. 1 each 5   Carbinoxamine Maleate 4 MG TABS Take 1 tablet (4 mg total) by mouth every 8 (eight) hours as needed. 90 tablet 5   cyclobenzaprine (FLEXERIL) 10 MG tablet Take 10 mg by mouth in the morning and at bedtime.  1   doxycycline (VIBRA-TABS) 100 MG tablet Take 1 tablet (100 mg total) by mouth 2 (two) times daily. 20 tablet 0   eletriptan (RELPAX) 40 MG tablet TAKE 1 TABLET BY MOUTH AS NEEDED. MAY REPEAT IN 2 HOURS IF NECESSARY. NO MORE THAN 2 IN 24 HOURS.     gabapentin (NEURONTIN) 300 MG capsule Take by mouth.     meclizine (ANTIVERT) 25 MG tablet Take 25 mg by mouth 3 (three) times daily as needed for dizziness.     meloxicam (MOBIC) 7.5 MG tablet Take by mouth.     montelukast (SINGULAIR) 10 MG tablet Take by mouth.     omeprazole (PRILOSEC) 40 MG capsule Take 1 capsule (40 mg total) by mouth daily. 30 capsule 11   ondansetron (ZOFRAN) 4 MG/5ML solution      phentermine (ADIPEX-P) 37.5 MG tablet Take by mouth.     No current facility-administered medications for this visit.   Allergies: Allergies  Allergen Reactions    Methylprednisolone Other (See Comments)    Paralytic reaction   Codeine    Dilaudid [Hydromorphone Hcl]    Morphine And Related Hives and Itching   Penicillins    Tramadol Nausea And Vomiting    Diarrhea (GI UPSET)   Pantoprazole Sodium Diarrhea and Nausea Only    Stomach cramps   I reviewed her past medical history, social history, family history, and environmental history and no significant changes have been reported from previous visit on 07/13/2021.   Objective: Physical Exam Not obtained as encounter was done via telephone.   Previous notes and tests were reviewed.  I discussed the assessment and treatment plan with the patient. The patient was provided an opportunity to ask questions and all were answered. The patient agreed with the plan and demonstrated an understanding of the instructions.   The patient was advised to call back or seek an in-person evaluation  if the symptoms worsen or if the condition fails to improve as anticipated.  I provided 33 minutes of non-face-to-face time during this encounter.  It was my pleasure to participate in [facility] care today. Please feel free to contact me with any questions or concerns.   Sincerely,  Gareth Morgan, FNP

## 2021-08-08 NOTE — Telephone Encounter (Signed)
Patient called stating she has been feeling under the weather for about a week today. She stated off with bad congestion, wheezing that has now turned into green mucous, bad taste in her mouth that also smells bad, post nasal drip & hoarseness. Patient thinks it may be a bad sinus infection.    [place]

## 2021-08-08 NOTE — Patient Instructions (Addendum)
Acute sinusitis Begin doxycycline 100 mg twice a day for 10 days Begin nasal saline rinses several times a day Stop carbinoxamine for the next 5 to 7 days if you can tolerate this Begin Mucinex 600 to 1200 mg twice a day and increase fluid intake in order to to thin mucus Call the clinic if your symptoms worsen, do not improve, or if you develop a fever  Asthma Continue Symbicort 80-2 puffs twice a day with a spacer to prevent cough or wheeze Continue albuterol 2 puffs once every 4 hours as needed for cough or wheeze You may use albuterol 2 puffs 5 to 15 minutes before activity to decrease cough or wheeze  Allergic rhinitis Continue allergen avoidance measures directed toward grass pollen, weed pollen, tree pollen, mold, cat, dog, dust mite, and cockroach as listed below Continue Nasacort 2 sprays in each nostril once a day as needed for stuffy nose Consider saline nasal rinses as needed for nasal symptoms. Use this before any medicated nasal sprays for best result After your nasal congestion resolves, continue carbinoxamine as needed for a runny nose or itch  Reflux Continue dietary and lifestyle modifications as listed below Continue omeprazole 40 mg once a day as previously prescribed to prevent reflux  Call the clinic if this treatment plan is not working well for you.  Follow up in the clinic in 1 month or sooner if needed.  Reducing Pollen Exposure The American Academy of Allergy, Asthma and Immunology suggests the following steps to reduce your exposure to pollen during allergy seasons. Do not hang sheets or clothing out to dry; pollen may collect on these items. Do not mow lawns or spend time around freshly cut grass; mowing stirs up pollen. Keep windows closed at night.  Keep car windows closed while driving. Minimize morning activities outdoors, a time when pollen counts are usually at their highest. Stay indoors as much as possible when pollen counts or humidity is high and  on windy days when pollen tends to remain in the air longer. Use air conditioning when possible.  Many air conditioners have filters that trap the pollen spores. Use a HEPA room air filter to remove pollen form the indoor air you breathe.  Control of Mold Allergen Mold and fungi can grow on a variety of surfaces provided certain temperature and moisture conditions exist.  Outdoor molds grow on plants, decaying vegetation and soil.  The major outdoor mold, Alternaria and Cladosporium, are found in very high numbers during hot and dry conditions.  Generally, a late Summer - Fall peak is seen for common outdoor fungal spores.  Rain will temporarily lower outdoor mold spore count, but counts rise rapidly when the rainy period ends.  The most important indoor molds are Aspergillus and Penicillium.  Dark, humid and poorly ventilated basements are ideal sites for mold growth.  The next most common sites of mold growth are the bathroom and the kitchen.  Outdoor [employer] Use air conditioning and keep windows closed Avoid exposure to decaying vegetation. Avoid leaf raking. Avoid grain handling. Consider wearing a face mask if working in moldy areas.  Indoor Mold Control Maintain humidity below 50%. Clean washable surfaces with 5% bleach solution. Remove sources e.g. Contaminated carpets.   Control of Dust Mite Allergen Dust mites play a major role in allergic asthma and rhinitis. They occur in environments with high humidity wherever human skin is found. Dust mites absorb humidity from the atmosphere (ie, they do not drink) and feed on organic  matter (including shed human and animal skin). Dust mites are a microscopic type of insect that you cannot see with the naked eye. High levels of dust mites have been detected from mattresses, pillows, carpets, upholstered furniture, bed covers, clothes, soft toys and any woven material. The principal allergen of the dust mite is found in its feces. A gram of  dust may contain 1,000 mites and 250,000 fecal particles. Mite antigen is easily measured in the air during house cleaning activities. Dust mites do not bite and do not cause harm to humans, other than by triggering allergies/asthma.  Ways to decrease your exposure to dust mites in your home:  1. Encase mattresses, box springs and pillows with a mite-impermeable barrier or cover  2. Wash sheets, blankets and drapes weekly in hot water (130 F) with detergent and dry them in a dryer on the hot setting.  3. Have the room cleaned frequently with a vacuum cleaner and a damp dust-mop. For carpeting or rugs, vacuuming with a vacuum cleaner equipped with a high-efficiency particulate air (HEPA) filter. The dust mite allergic individual should not be in a room which is being cleaned and should wait 1 hour after cleaning before going into the room.  4. Do not sleep on upholstered furniture (eg, couches).  5. If possible removing carpeting, upholstered furniture and drapery from the home is ideal. Horizontal blinds should be eliminated in the rooms where the person spends the most time (bedroom, study, television room). Washable vinyl, roller-type shades are optimal.  6. Remove all non-washable stuffed toys from the bedroom. Wash stuffed toys weekly like sheets and blankets above.  7. Reduce indoor humidity to less than 50%. Inexpensive humidity monitors can be purchased at most hardware stores. Do not use a humidifier as can make the problem worse and are not recommended.  Control of Dog or Cat Allergen Avoidance is the best way to manage a dog or cat allergy. If you have a dog or cat and are allergic to dog or cats, consider removing the dog or cat from the home. If you have a dog or cat but dont want to find it a new home, or if your family wants a pet even though someone in the household is allergic, here are some strategies that may help keep symptoms at bay:  Keep the pet out of your bedroom and  restrict it to only a few rooms. Be advised that keeping the dog or cat in only one room will not limit the allergens to that room. Dont pet, hug or kiss the dog or cat; if you do, wash your hands with soap and water. High-efficiency particulate air (HEPA) cleaners run continuously in a bedroom or living room can reduce allergen levels over time. Regular use of a high-efficiency vacuum cleaner or a central vacuum can reduce allergen levels. Giving your dog or cat a bath at least once a week can reduce airborne allergen.  Control of Cockroach Allergen Cockroach allergen has been identified as an important cause of acute attacks of asthma, especially in urban settings.  There are fifty-five species of cockroach that exist in the [place], however only three, the [place], [place] species produce allergen that can affect patients with Asthma.  Allergens can be obtained from fecal particles, egg casings and secretions from cockroaches.    Remove food sources. Reduce access to water. Seal access and entry points. Spray runways with 0.5-1% Diazinon or Chlorpyrifos Blow boric acid power under stoves and refrigerator.  Place bait stations (hydramethylnon) at feeding sites.

## 2021-08-08 NOTE — Telephone Encounter (Signed)
Webb Silversmith will you please advise on behalf of Dr. Ernst Bowler since he is out today?

## 2021-08-08 NOTE — Telephone Encounter (Signed)
Called and spoke with patient, she has been scheduled for a televisit today with Energy manager.

## 2021-08-08 NOTE — Telephone Encounter (Signed)
Can you please have this patient make a televisit appointment? I have some open appts this afternoon. Thank you

## 2021-08-09 NOTE — Telephone Encounter (Signed)
Thanks for taking care of our patients!   Salvatore Marvel, MD [facility] [place]

## 2021-08-17 ENCOUNTER — Other Ambulatory Visit: Payer: Self-pay

## 2021-08-17 ENCOUNTER — Ambulatory Visit (INDEPENDENT_AMBULATORY_CARE_PROVIDER_SITE_OTHER): Payer: Medicaid Other | Admitting: Clinical

## 2021-08-17 DIAGNOSIS — F331 Major depressive disorder, recurrent, moderate: Secondary | ICD-10-CM

## 2021-08-17 DIAGNOSIS — F419 Anxiety disorder, unspecified: Secondary | ICD-10-CM | POA: Diagnosis not present

## 2021-08-17 DIAGNOSIS — F4310 Post-traumatic stress disorder, unspecified: Secondary | ICD-10-CM | POA: Diagnosis not present

## 2021-08-17 NOTE — Progress Notes (Signed)
Virtual Visit via Telephone Note ?  ?I connected with Linda Cochran on 08/17/21 at 1:00 PM EST by telephone and verified that I am speaking with the correct person using two identifiers. ?  ?Location: ?Patient: Home ?Provider: Office ?  ?I discussed the limitations, risks, security and privacy concerns of performing an evaluation and management service by telephone and the availability of in person appointments. I also discussed with the patient that there may be a patient responsible charge related to this service. The patient expressed understanding and agreed to proceed. ?  ?  ?THERAPIST PROGRESS NOTE ?  ?Session Time: 1:00 PM-1:55 PM ?  ?Participation Level: Active ?  ?Behavioral Response: CasualAlertDepressed ?  ?Type of Therapy: Individual Therapy ?  ?Treatment Goals addressed: Anger and Coping ?  ?Interventions: CBT, Motivational Interviewing, Solution Focused and Strength-based ?  ?Summary: Linda Cochran is a 46 y.o. female who presents with Depression Anxiety and PTSD. The OPT therapist worked with the patient for her ongoing OPT treatment. The OPT therapist utilized Motivational Interviewing to assist in creating therapeutic repore. The patient in the session was engaged and work in collaboration giving feedback about her triggers and symptoms over the past few weeks. The patient spoke about her son getting caught skipping earlier today and him getting ISS.The patient spoke about her work in currently moving and having difficulty with multiple transitions happening at once including adjusting to her children becoming adults and leaving the home.The OPT therapist utilized Cognitive Behavioral Therapy through cognitive restructuring as well as worked with the patient on coping strategies to assist in management of mood. The OPT therapist worked in the session with the patient on challenging negative thoughts and implementing positive thinking and working within her limits stay healthy and active. The OPT  therapist worked with the patient overiewing her basic health areas of sleep/eating/physical exercise/ and hygiene.  ?  ?Suicidal/Homicidal: Nowithout intent/plan ?  ?Therapist Response: The OPT therapist worked with the patient for the patients scheduled session. The patient was engaged in her session and gave feedback in relation to triggers, symptoms, and behavior responses over the past few weeks. The patient spoke about the impact of multiple external factors including  moving, children becoming adults and leaving the home, and her getting resettled with her new living arrangement.The OPT therapist worked with the patient utilizing an in session Cognitive Behavioral Therapy exercise. The patient was responsive and spoke about her realization of the need to change her thought lens and perspective and try for lower hanging fruit. The OPT therapist worked with the patient about being mindful of her emotions and mood and doing self checkins throughout her week and implementing coping as needed to keep the patient at her mood baseline. The OPT therapist worked with the patient on self care focus.The OPT therapist reviewed the importance of continuing to be consistent with all recommendations and scheduled health appointments.The patient spoke about her determination to focus more on herself and take care of her basic needs. ?  ?Plan: Return again in 2/3 weeks. ?  ?Diagnosis:      Axis I: Depression/Anxiety/and PTSD  ?                        Axis II: No diagnosis ? ? ?Collaboration of Care: No additional collaboration of care for this session. ?  ?Patient/Guardian was advised Release of Information must be obtained prior to any record release in order to collaborate their care with an outside provider.  Patient/Guardian was advised if they have not already done so to contact the registration department to sign all necessary forms in order for [place] to release information regarding their care.  ?  ?Consent:  Patient/Guardian gives verbal consent for treatment and assignment of benefits for services provided during this visit. Patient/Guardian expressed understanding and agreed to proceed.  ? ?  ?I discussed the assessment and treatment plan with the patient. The patient was provided an opportunity to ask questions and all were answered. The patient agreed with the plan and demonstrated an understanding of the instructions. ?  ?The patient was advised to call back or seek an in-person evaluation if the symptoms worsen or if the condition fails to improve as anticipated. ?  ?I provided 55 minutes of non-face-to-face time during this encounter. ?  ?Alixandrea Milleson T. Eulas Post, LCSW ?  ?08/17/2021 ?

## 2021-08-22 ENCOUNTER — Other Ambulatory Visit: Payer: Self-pay | Admitting: Internal Medicine

## 2021-08-29 ENCOUNTER — Ambulatory Visit (INDEPENDENT_AMBULATORY_CARE_PROVIDER_SITE_OTHER): Payer: Medicaid Other | Admitting: Family Medicine

## 2021-08-29 ENCOUNTER — Other Ambulatory Visit: Payer: Self-pay

## 2021-08-29 ENCOUNTER — Encounter: Payer: Self-pay | Admitting: Family Medicine

## 2021-08-29 VITALS — BP 108/80 | HR 111 | Temp 98.0°F | Resp 16 | Ht 65.5 in | Wt 189.0 lb

## 2021-08-29 DIAGNOSIS — K219 Gastro-esophageal reflux disease without esophagitis: Secondary | ICD-10-CM | POA: Diagnosis not present

## 2021-08-29 DIAGNOSIS — J4540 Moderate persistent asthma, uncomplicated: Secondary | ICD-10-CM | POA: Diagnosis not present

## 2021-08-29 DIAGNOSIS — R040 Epistaxis: Secondary | ICD-10-CM

## 2021-08-29 DIAGNOSIS — J302 Other seasonal allergic rhinitis: Secondary | ICD-10-CM

## 2021-08-29 DIAGNOSIS — H1013 Acute atopic conjunctivitis, bilateral: Secondary | ICD-10-CM

## 2021-08-29 DIAGNOSIS — J3089 Other allergic rhinitis: Secondary | ICD-10-CM

## 2021-08-29 DIAGNOSIS — Z91038 Other insect allergy status: Secondary | ICD-10-CM

## 2021-08-29 DIAGNOSIS — H1010 Acute atopic conjunctivitis, unspecified eye: Secondary | ICD-10-CM | POA: Insufficient documentation

## 2021-08-29 MED ORDER — CARBINOXAMINE MALEATE 4 MG PO TABS: 4.0000 mg | ORAL_TABLET | Freq: Three times a day (TID) | ORAL | 5 refills | Status: DC | PRN

## 2021-08-29 MED ORDER — EPINEPHRINE 0.3 MG/0.3ML IJ SOAJ: 0.3000 mg | Freq: Once | INTRAMUSCULAR | 1 refills | Status: AC

## 2021-08-29 MED ORDER — MONTELUKAST SODIUM 10 MG PO TABS: 10.0000 mg | ORAL_TABLET | Freq: Every evening | ORAL | 5 refills | Status: DC

## 2021-08-29 MED ORDER — AZELASTINE HCL 0.1 % NA SOLN: 2.0000 | Freq: Two times a day (BID) | NASAL | 5 refills | Status: DC

## 2021-08-29 MED ORDER — OMEPRAZOLE 40 MG PO CPDR: DELAYED_RELEASE_CAPSULE | ORAL | 11 refills | Status: DC

## 2021-08-29 MED ORDER — BUDESONIDE-FORMOTEROL FUMARATE 80-4.5 MCG/ACT IN AERO: 2.0000 | INHALATION_SPRAY | Freq: Two times a day (BID) | RESPIRATORY_TRACT | 5 refills | Status: DC

## 2021-08-29 NOTE — Patient Instructions (Addendum)
Asthma ?Continue Symbicort 80-2 puffs twice a day with a spacer to prevent cough or wheeze ?Continue albuterol 2 puffs once every 4 hours as needed for cough or wheeze ?You may use albuterol 2 puffs 5 to 15 minutes before activity to decrease cough or wheeze ?You do qualify for a biologic medication for asthma management if necessary. Written information provided ? ?Allergic rhinitis ?Continue allergen avoidance measures directed toward grass pollen, weed pollen, tree pollen, mold, cat, dog, dust mite, and cockroach as listed below ?Continue carbinoxamine 4 mg tablets once every 6-8 hours as needed for a runny nose or itch ?Continue Nasacort 2 sprays in each nostril once a day as needed for stuffy nose ?Consider saline nasal rinses as needed for nasal symptoms. Use this before any medicated nasal sprays for best result ?Continue azelastine 2 sprays in each nostril up to twice a day as needed for a runny nose ?Consider nasal saline gel as needed for dry nostrils ?If your symptoms of allergic rhinitis are not well controlled with the treatment plan as listed above, consider allergen immunotherapy. Written information provided.  ? ?Allergic conjunctivitis ?Some over the counter eye drops include Pataday one drop in each eye once a day as needed for red, itchy eyes OR Zaditor one drop in each eye twice a day as needed for red itchy eyes. ? ?Reflux ?Continue dietary and lifestyle modifications as listed below ?Restart omeprazole 40 mg once a day as previously prescribed to prevent reflux ? ?Epistaxis ?Pinch both nostrils while leaning forward for at least 5 minutes before checking to see if the bleeding has stopped. If bleeding is not controlled within 5-10 minutes apply a cotton ball soaked with oxymetazoline (Afrin) to the bleeding nostril for a few seconds.  ?If the problem persists or worsens a referral to ENT for further evaluation may be necessary. ? ?Stinging insect allergy ?Continue to avoid stinging insects.  In  case of an allergic reaction, take Benadryl 50 mg every 4 hours, and if life-threatening symptoms occur, inject with EpiPen 0.3 mg. ?A lab order has been placed to help [place] assess your stinging insect allergy.  We will call you when the result becomes available ? ?Call the clinic if this treatment plan is not working well for you. ? ?Follow up in the clinic in 3 months or sooner if needed. ? ?Reducing Pollen Exposure ?The American Academy of Allergy, Asthma and Immunology suggests the following steps to reduce your exposure to pollen during allergy seasons. ?Do not hang sheets or clothing out to dry; pollen may collect on these items. ?Do not mow lawns or spend time around freshly cut grass; mowing stirs up pollen. ?Keep windows closed at night.  Keep car windows closed while driving. ?Minimize morning activities outdoors, a time when pollen counts are usually at their highest. ?Stay indoors as much as possible when pollen counts or humidity is high and on windy days when pollen tends to remain in the air longer. ?Use air conditioning when possible.  Many air conditioners have filters that trap the pollen spores. ?Use a HEPA room air filter to remove pollen form the indoor air you breathe. ? ?Control of Mold Allergen ?Mold and fungi can grow on a variety of surfaces provided certain temperature and moisture conditions exist.  Outdoor molds grow on plants, decaying vegetation and soil.  The major outdoor mold, Alternaria and Cladosporium, are found in very high numbers during hot and dry conditions.  Generally, a late Summer - Fall peak is seen  for common outdoor fungal spores.  Rain will temporarily lower outdoor mold spore count, but counts rise rapidly when the rainy period ends.  The most important indoor molds are Aspergillus and Penicillium.  Dark, humid and poorly ventilated basements are ideal sites for mold growth.  The next most common sites of mold growth are the bathroom and the kitchen. ? ?Outdoor [employer] ?Use air conditioning and keep windows closed ?Avoid exposure to decaying vegetation. ?Avoid leaf raking. ?Avoid grain handling. ?Consider wearing a face mask if working in moldy areas. ? ?Indoor Mold Control ?Maintain humidity below 50%. ?Clean washable surfaces with 5% bleach solution. ?Remove sources e.g. Contaminated carpets. ? ? ?Control of Dust Mite Allergen ?Dust mites play a major role in allergic asthma and rhinitis. They occur in environments with high humidity wherever human skin is found. Dust mites absorb humidity from the atmosphere (ie, they do not drink) and feed on organic matter (including shed human and animal skin). Dust mites are a microscopic type of insect that you cannot see with the naked eye. High levels of dust mites have been detected from mattresses, pillows, carpets, upholstered furniture, bed covers, clothes, soft toys and any woven material. The principal allergen of the dust mite is found in its feces. A gram of dust may contain 1,000 mites and 250,000 fecal particles. Mite antigen is easily measured in the air during house cleaning activities. Dust mites do not bite and do not cause harm to humans, other than by triggering allergies/asthma. ? ?Ways to decrease your exposure to dust mites in your home: ? ?1. Encase mattresses, box springs and pillows with a mite-impermeable barrier or cover ? ?2. Wash sheets, blankets and drapes weekly in hot water (130? F) with detergent and dry them in a dryer on the hot setting. ? ?3. Have the room cleaned frequently with a vacuum cleaner and a damp dust-mop. For carpeting or rugs, vacuuming with a vacuum cleaner equipped with a high-efficiency particulate air (HEPA) filter. The dust mite allergic individual should not be in a room which is being cleaned and should wait 1 hour after cleaning before going into the room. ? ?4. Do not sleep on upholstered furniture (eg, couches). ? ?5. If possible removing carpeting, upholstered furniture and  drapery from the home is ideal. Horizontal blinds should be eliminated in the rooms where the person spends the most time (bedroom, study, television room). Washable vinyl, roller-type shades are optimal. ? ?6. Remove all non-washable stuffed toys from the bedroom. Wash stuffed toys weekly like sheets and blankets above. ? ?7. Reduce indoor humidity to less than 50%. Inexpensive humidity monitors can be purchased at most hardware stores. Do not use a humidifier as can make the problem worse and are not recommended. ? ?Control of Dog or Cat Allergen ?Avoidance is the best way to manage a dog or cat allergy. If you have a dog or cat and are allergic to dog or cats, consider removing the dog or cat from the home. ?If you have a dog or cat but don?t want to find it a new home, or if your family wants a pet even though someone in the household is allergic, here are some strategies that may help keep symptoms at bay: ? ?Keep the pet out of your bedroom and restrict it to only a few rooms. Be advised that keeping the dog or cat in only one room will not limit the allergens to that room. ?Don?t pet, hug or kiss the dog  or cat; if you do, wash your hands with soap and water. ?High-efficiency particulate air (HEPA) cleaners run continuously in a bedroom or living room can reduce allergen levels over time. ?Regular use of a high-efficiency vacuum cleaner or a central vacuum can reduce allergen levels. ?Giving your dog or cat a bath at least once a week can reduce airborne allergen. ? ?Control of Cockroach Allergen ?Cockroach allergen has been identified as an important cause of acute attacks of asthma, especially in urban settings.  There are fifty-five species of cockroach that exist in the [place], however only three, the [place], [place] species produce allergen that can affect patients with Asthma.  Allergens can be obtained from fecal particles, egg casings and secretions from cockroaches. ?   ?Remove  food sources. ?Reduce access to water. ?Seal access and entry points. ?Spray runways with 0.5-1% Diazinon or Chlorpyrifos ?Blow boric acid power under stoves and refrigerator. ?Place bait stations (hyd

## 2021-08-29 NOTE — Progress Notes (Signed)
? ?[address], [place] [address] ?Dept: [phone] ? ?FOLLOW UP NOTE ? ?Patient ID: Linda Cochran, female    DOB: 1975-06-27  Age: 46 y.o. MRN: [id number] ?Date of Office Visit: 08/29/2021 ? ?Assessment  ?Chief Complaint: Sinusitis (Says she is dizzy and nauseous. Says it is worse when she lays down. ) and Asthma (Says her asthma is we.. ACT:19) ? ?HPI ?Linda Cochran is a 46 year old female who presents the clinic for follow-up visit.  She was last seen in this clinic on 08/08/2021 via televisit by Gareth Morgan, FNP, for evaluation of asthma, acute sinusitis, allergic rhinitis, and reflux.  At that time, she required doxycycline for resolution of acute sinusitis.  At today's visit, she reports her asthma has been moderately well controlled since her last visit to this clinic.  She reports a significant improvement while continuing Symbicort.  She does report occasional shortness of breath especially with activity and occasional cough which sometimes produces clear mucus and occasionally is dry.  She continues montelukast 10 mg once a day, Symbicort 80-2 puffs twice a day with a spacer, and uses albuterol about once or twice a week with relief of symptoms.  Allergic rhinitis is reported as moderately well controlled with symptoms including intermittent nasal congestion, sneezing especially when around dogs or pollen, and postnasal drainage occurring mostly at nighttime.  She continues nasal saline rinses and carbinoxamine 4 mg about once a day.  Allergic conjunctivitis is reported as moderately well controlled with symptoms including red and itchy eyes for which she uses an over-the-counter allergy eyedrop with moderate relief of symptoms.  Epistaxis is reported as moderately well controlled with bleeding occurring from either nostril especially during the spring mostly at nighttime and resolving in under 5 minutes.  Reflux is reported as poorly controlled with heartburn occurring both day and night  over the last several days.  She does report that she has run out of omeprazole 40 mg 4 days ago.  She reports that while taking omeprazole symptoms of reflux are well controlled.  She reports a history of stinging insect allergy with symptoms including large local swelling resulting in cellulitis as well as throat closing.  She does not have an epinephrine autoinjector at this time due to cost.  Her current medications are listed in the chart. ? ? ?Drug Allergies:  ?Allergies  ?Allergen Reactions  ? Methylprednisolone Other (See Comments)  ?  Paralytic reaction  ? Codeine Hives and Itching  ? Dilaudid [Hydromorphone Hcl] Hives and Itching  ? Morphine And Related Hives and Itching  ? Pantoprazole Sodium Diarrhea and Nausea Only  ?  Stomach cramps  ? Penicillins Hives  ? Tramadol Diarrhea  ?  Diarrhea (GI UPSET)  ? ? ?Physical Exam: ?BP 108/80   Pulse (!) 111   Temp 98 ?F (36.7 ?C) (Temporal)   Resp 16   Ht 5' 5.5" (1.664 m)   Wt 189 lb (85.7 kg)   SpO2 100%   BMI 30.97 kg/m?   ? ?Physical Exam ?Vitals reviewed.  ?Constitutional:   ?   Appearance: Normal appearance.  ?HENT:  ?   Head: Normocephalic and atraumatic.  ?   Right Ear: Tympanic membrane normal.  ?   Left Ear: Tympanic membrane normal.  ?   Nose:  ?   Comments: Bilateral nares slightly erythematous with clear nasal drainage noted.  Pharynx normal.  Ears normal.  Eyes normal. ?   Mouth/Throat:  ?   Pharynx: Oropharynx is clear.  ?Eyes:  ?  Conjunctiva/sclera: Conjunctivae normal.  ?Cardiovascular:  ?   Rate and Rhythm: Normal rate and regular rhythm.  ?   Heart sounds: Normal heart sounds. No murmur heard. ?Pulmonary:  ?   Effort: Pulmonary effort is normal.  ?   Breath sounds: Normal breath sounds.  ?   Comments: Lungs clear to auscultation ?Musculoskeletal:     ?   General: Normal range of motion.  ?   Cervical back: Normal range of motion and neck supple.  ?Skin: ?   General: Skin is warm and dry.  ?Neurological:  ?   Mental Status: She is alert  and oriented to person, place, and time.  ?Psychiatric:     ?   Mood and Affect: Mood normal.     ?   Behavior: Behavior normal.     ?   Thought Content: Thought content normal.     ?   Judgment: Judgment normal.  ? ? ?Diagnostics: ?FVC 3.40, FEV1 2.64.  Predicted FVC 3.64, FEV1 2.92.  Spirometry indicates normal ventilatory function. ? ?Assessment and Plan: ?1. Moderate persistent asthma, uncomplicated   ?2. Seasonal and perennial allergic rhinitis   ?3. Seasonal allergic conjunctivitis   ?4. Gastroesophageal reflux disease, unspecified whether esophagitis present   ?5. Epistaxis   ?6. Hymenoptera allergy   ? ? ?Meds ordered this encounter  ?Medications  ? budesonide-formoterol (SYMBICORT) 80-4.5 MCG/ACT inhaler  ?  Sig: Inhale 2 puffs into the lungs in the morning and at bedtime.  ?  Dispense:  1 each  ?  Refill:  5  ? Carbinoxamine Maleate 4 MG TABS  ?  Sig: Take 1 tablet (4 mg total) by mouth every 8 (eight) hours as needed.  ?  Dispense:  90 tablet  ?  Refill:  5  ? montelukast (SINGULAIR) 10 MG tablet  ?  Sig: Take 1 tablet (10 mg total) by mouth at bedtime.  ?  Dispense:  30 tablet  ?  Refill:  5  ? omeprazole (PRILOSEC) 40 MG capsule  ?  Sig: TAKE 1 CAPSULE(40 MG) BY MOUTH DAILY  ?  Dispense:  30 capsule  ?  Refill:  11  ? azelastine (ASTELIN) 0.1 % nasal spray  ?  Sig: Place 2 sprays into both nostrils 2 (two) times daily. Use in each nostril as directed  ?  Dispense:  30 mL  ?  Refill:  5  ? EPINEPHrine (EPIPEN 2-PAK) 0.3 mg/0.3 mL IJ SOAJ injection  ?  Sig: Inject 0.3 mg into the muscle once for 1 dose.  ?  Dispense:  0.3 mL  ?  Refill:  1  ? ? ?Patient Instructions  ?Asthma ?Continue Symbicort 80-2 puffs twice a day with a spacer to prevent cough or wheeze ?Continue albuterol 2 puffs once every 4 hours as needed for cough or wheeze ?You may use albuterol 2 puffs 5 to 15 minutes before activity to decrease cough or wheeze ?You do qualify for a biologic medication for asthma management if necessary.  Written information provided ? ?Allergic rhinitis ?Continue allergen avoidance measures directed toward grass pollen, weed pollen, tree pollen, mold, cat, dog, dust mite, and cockroach as listed below ?Continue carbinoxamine 4 mg tablets once every 6-8 hours as needed for a runny nose or itch ?Continue Nasacort 2 sprays in each nostril once a day as needed for stuffy nose ?Consider saline nasal rinses as needed for nasal symptoms. Use this before any medicated nasal sprays for best result ?Continue azelastine 2 sprays in each  nostril up to twice a day as needed for a runny nose ?Consider nasal saline gel as needed for dry nostrils ?If your symptoms of allergic rhinitis are not well controlled with the treatment plan as listed above, consider allergen immunotherapy. Written information provided.  ? ?Allergic conjunctivitis ?Some over the counter eye drops include Pataday one drop in each eye once a day as needed for red, itchy eyes OR Zaditor one drop in each eye twice a day as needed for red itchy eyes. ? ?Reflux ?Continue dietary and lifestyle modifications as listed below ?Restart omeprazole 40 mg once a day as previously prescribed to prevent reflux ? ?Epistaxis ?Pinch both nostrils while leaning forward for at least 5 minutes before checking to see if the bleeding has stopped. If bleeding is not controlled within 5-10 minutes apply a cotton ball soaked with oxymetazoline (Afrin) to the bleeding nostril for a few seconds.  ?If the problem persists or worsens a referral to ENT for further evaluation may be necessary. ? ?Stinging insect allergy ?Continue to avoid stinging insects.  In case of an allergic reaction, take Benadryl 50 mg every 4 hours, and if life-threatening symptoms occur, inject with EpiPen 0.3 mg. ?A lab order has been placed to help [place] assess your stinging insect allergy.  We will call you when the result becomes available ? ?Call the clinic if this treatment plan is not working well for  you. ? ?Follow up in the clinic in 3 months or sooner if needed. ? ? ?Return in about 3 months (around 11/29/2021), or if symptoms worsen or fail to improve. ?  ? ?Thank you for the opportunity to care for this patient.  Pleas

## 2021-08-30 ENCOUNTER — Encounter: Payer: Self-pay | Admitting: Obstetrics & Gynecology

## 2021-08-30 ENCOUNTER — Ambulatory Visit (INDEPENDENT_AMBULATORY_CARE_PROVIDER_SITE_OTHER): Payer: Medicaid Other | Admitting: Obstetrics & Gynecology

## 2021-08-30 ENCOUNTER — Other Ambulatory Visit (HOSPITAL_COMMUNITY)
Admission: RE | Admit: 2021-08-30 | Discharge: 2021-08-30 | Disposition: A | Discharge status: Home / Self Care | Payer: Medicaid Other | Source: Ambulatory Visit | Attending: Obstetrics & Gynecology | Admitting: Obstetrics & Gynecology

## 2021-08-30 VITALS — BP 120/80 | Ht 65.5 in | Wt 189.0 lb

## 2021-08-30 DIAGNOSIS — R87612 Low grade squamous intraepithelial lesion on cytologic smear of cervix (LGSIL): Secondary | ICD-10-CM | POA: Insufficient documentation

## 2021-08-30 DIAGNOSIS — Z01419 Encounter for gynecological examination (general) (routine) without abnormal findings: Secondary | ICD-10-CM | POA: Diagnosis not present

## 2021-08-30 DIAGNOSIS — Z780 Asymptomatic menopausal state: Secondary | ICD-10-CM

## 2021-08-30 DIAGNOSIS — Z113 Encounter for screening for infections with a predominantly sexual mode of transmission: Secondary | ICD-10-CM | POA: Diagnosis not present

## 2021-08-30 MED ORDER — ESTRADIOL 1 MG PO TABS: 1.0000 mg | ORAL_TABLET | Freq: Every day | ORAL | 3 refills | Status: AC

## 2021-08-30 NOTE — Progress Notes (Signed)
? ?HPI: ?     Linda Cochran is a 46 y.o. [id number] who LMP was No LMP recorded. Patient has had a hysterectomy., she presents today for her annual examination. The patient has no complaints today other than HOT FLASHES and SWEATS, INSOMNIA, and WEIGHT GAIN.  The patient is sexually active. Her last pap: approximate date 2021  and was abnormal: LGSIL (vag PAP); and last mammogram: approximate date 2023 and was normal. The patient does perform self breast exams.  There is no notable family history of breast or ovarian cancer in her family.  The patient has regular exercise: yes.  The patient denies current symptoms of depression.   ? ?GYN History: ?Contraception: status post hysterectomy ? ?PMHx: ?Past Medical History:  ?Diagnosis Date  ? Acid reflux   ? Chickenpox   ? Depression   ? IBS (irritable bowel syndrome)   ? Migraine aura, persistent   ? Papilloma   ? anus  ? Vitamin D deficiency   ? ?Past Surgical History:  ?Procedure Laterality Date  ? ABDOMINAL HYSTERECTOMY    ? CESAREAN SECTION  2005  ? CHOLECYSTECTOMY  2007  ? COLONOSCOPY  2019  ? CYST REMOVAL NECK  1995  ? laproscopy  2005  ? uterine prolapse repair    ? ?Family History  ?Problem Relation Age of Onset  ? Kidney failure Mother   ? Hypertension Mother   ? Migraines Mother   ? Skin cancer Mother   ? Cervical cancer Mother   ? Diabetes Mother   ? Hypertension Father   ? Diabetes Father   ? Breast cancer Paternal Aunt   ? Brain cancer Paternal Aunt   ? Uterine cancer Maternal Grandmother   ? Kidney failure Maternal Grandmother   ? Colon cancer Maternal Grandfather   ? Skin cancer Maternal Grandfather   ? Diabetes Maternal Grandfather   ? Kidney failure Paternal Grandfather   ? ?Social History  ? ?Tobacco Use  ? Smoking status: Never  ? Smokeless tobacco: Never  ?Vaping Use  ? Vaping Use: Never used  ?Substance Use Topics  ? Alcohol use: No  ? Drug use: No  ? ? ?Current Outpatient Medications:  ?  albuterol (VENTOLIN HFA) 108 (90 Base) MCG/ACT inhaler,  Inhale into the lungs., Disp: , Rfl:  ?  amitriptyline (ELAVIL) 50 MG tablet, TAKE 1 TABLET BY MOUTH EVERYDAY AT BEDTIME (Patient taking differently: 25 mg. TAKE 1 TABLET BY MOUTH EVERYDAY AT BEDTIME), Disp: 90 tablet, Rfl: 0 ?  azelastine (ASTELIN) 0.1 % nasal spray, Place 2 sprays into both nostrils 2 (two) times daily. Use in each nostril as directed, Disp: 30 mL, Rfl: 5 ?  budesonide-formoterol (SYMBICORT) 80-4.5 MCG/ACT inhaler, Inhale 2 puffs into the lungs in the morning and at bedtime., Disp: 1 each, Rfl: 5 ?  Carbinoxamine Maleate 4 MG TABS, Take 1 tablet (4 mg total) by mouth every 8 (eight) hours as needed., Disp: 90 tablet, Rfl: 5 ?  cyclobenzaprine (FLEXERIL) 10 MG tablet, Take 10 mg by mouth in the morning and at bedtime., Disp: , Rfl: 1 ?  eletriptan (RELPAX) 40 MG tablet, TAKE 1 TABLET BY MOUTH AS NEEDED. MAY REPEAT IN 2 HOURS IF NECESSARY. NO MORE THAN 2 IN 24 HOURS., Disp: , Rfl:  ?  estradiol (ESTRACE) 1 MG tablet, Take 1 tablet (1 mg total) by mouth daily., Disp: 90 tablet, Rfl: 3 ?  meclizine (ANTIVERT) 25 MG tablet, Take 25 mg by mouth 3 (three) times daily as needed  for dizziness., Disp: , Rfl:  ?  meloxicam (MOBIC) 7.5 MG tablet, Take by mouth., Disp: , Rfl:  ?  montelukast (SINGULAIR) 10 MG tablet, Take 1 tablet (10 mg total) by mouth at bedtime., Disp: 30 tablet, Rfl: 5 ?  omeprazole (PRILOSEC) 40 MG capsule, TAKE 1 CAPSULE(40 MG) BY MOUTH DAILY, Disp: 30 capsule, Rfl: 11 ?  ondansetron (ZOFRAN) 4 MG/5ML solution, , Disp: , Rfl:  ?  gabapentin (NEURONTIN) 300 MG capsule, Take 600 mg by mouth 2 (two) times daily., Disp: , Rfl:  ?Allergies: Methylprednisolone, Codeine, Dilaudid [hydromorphone hcl], Morphine and related, Pantoprazole sodium, Penicillins, and Tramadol ? ?Review of Systems  ?Constitutional:  Negative for chills, fever and malaise/fatigue.  ?HENT:  Negative for congestion, sinus pain and sore throat.   ?Eyes:  Negative for blurred vision and pain.  ?Respiratory:  Negative for  cough and wheezing.   ?Cardiovascular:  Negative for chest pain and leg swelling.  ?Gastrointestinal:  Negative for abdominal pain, constipation, diarrhea, heartburn, nausea and vomiting.  ?Genitourinary:  Negative for dysuria, frequency, hematuria and urgency.  ?Musculoskeletal:  Negative for back pain, joint pain, myalgias and neck pain.  ?Skin:  Negative for itching and rash.  ?Neurological:  Negative for dizziness, tremors and weakness.  ?Endo/Heme/Allergies:  Does not bruise/bleed easily.  ?Psychiatric/Behavioral:  Negative for depression. The patient is not nervous/anxious and does not have insomnia.   ? ?Objective: ?BP 120/80   Ht 5' 5.5" (1.664 m)   Wt 189 lb (85.7 kg)   BMI 30.97 kg/m?   ?Filed Weights  ? 08/30/21 1323  ?Weight: 189 lb (85.7 kg)  ? Body mass index is 30.97 kg/m?Marland Kitchen ?Physical Exam ?Constitutional:   ?   General: She is not in acute distress. ?   Appearance: She is well-developed.  ?Genitourinary:  ?   Vulva, bladder, rectum and urethral meatus normal.  ?   No lesions in the vagina.  ?   Genitourinary Comments: Vaginal cuff well healed  ?   Right Labia: No rash, tenderness or lesions. ?   Left Labia: No tenderness, lesions or rash. ?   No vaginal bleeding.  ? ?   Right Adnexa: not tender and no mass present. ?   Left Adnexa: not tender and no mass present. ?   Cervix is absent.  ?   Uterus is absent.  ?   Pelvic exam was performed with patient in the lithotomy position.  ?Breasts: ?   Right: No mass, skin change or tenderness.  ?   Left: No mass, skin change or tenderness.  ?HENT:  ?   Head: Normocephalic and atraumatic. No laceration.  ?   Right Ear: Hearing normal.  ?   Left Ear: Hearing normal.  ?   Mouth/Throat:  ?   Pharynx: Uvula midline.  ?Eyes:  ?   Pupils: Pupils are equal, round, and reactive to light.  ?Neck:  ?   Thyroid: No thyromegaly.  ?Cardiovascular:  ?   Rate and Rhythm: Normal rate and regular rhythm.  ?   Heart sounds: No murmur heard. ?  No friction rub. No gallop.   ?Pulmonary:  ?   Effort: Pulmonary effort is normal. No respiratory distress.  ?   Breath sounds: Normal breath sounds. No wheezing.  ?Abdominal:  ?   General: Bowel sounds are normal. There is no distension.  ?   Palpations: Abdomen is soft.  ?   Tenderness: There is no abdominal tenderness. There is no rebound.  ?Musculoskeletal:     ?  General: Normal range of motion.  ?   Cervical back: Normal range of motion and neck supple.  ?Neurological:  ?   Mental Status: She is alert and oriented to person, place, and time.  ?   Cranial Nerves: No cranial nerve deficit.  ?Skin: ?   General: Skin is warm and dry.  ?Psychiatric:     ?   Judgment: Judgment normal.  ?Vitals reviewed.  ? ? ?Assessment:  ANNUAL EXAM ?1. Women's annual routine gynecological examination   ?2. LGSIL on Pap smear of cervix   ?3. Screen for STD (sexually transmitted disease)   ?4. Menopause   ? ? ? ?Screening Plan: ?           ?1.  Vaginal Screening-  Pap smear done today ?Prior LGSIL ?Colpo if high grade ? ?2. Breast screening- Exam annually and mammogram>40 planned  ? ?3. Colonoscopy every 10 years, Hemoccult testing - after age 94 ? ?91. Labs managed by PCP ? ?5. Screen for STD (sexually transmitted disease) ?- w PAP ?- HEP, RPR, HIV Panel ? ?6. Menopause ?Counseled on all options ?Patient with bothersome menopausal vasomotor symptoms. Discussed lifestyle interventions such as wearing light clothing, remaining in cool environments, having fan/air conditioner in the room, avoiding hot beverages etc.  Exercise also shown to be significantly helpful in alleviating hot flashes.  Discussed using hormone therapy and concerns about increased risk of heart disease, cerebrovascular disease, thromboembolic disease,  and breast cancer.  Also discussed other medical options such as Clonidine, SSRI, or Neurontin.   Also discussed alternative therapies such as herbal remedies but cautioned that most of the products contained phytoestrogens (plant estrogens)  in unregulated amounts which can have the same effects on the body as the pharmaceutical estrogen preparations.  Patient opted for ERT therapy for now. She will return in 2 months for re-evaluation. ?- estradiol (EST

## 2021-08-30 NOTE — Patient Instructions (Signed)
PAP every year ?Mammogram every year ?   Call [phone] to schedule at [facility] ?Colonoscopy every 10 years (age 48) ?Labs yearly (with PCP) ? ?Thank you for choosing [facility]. As part of our ongoing efforts to improve patient experience, we would appreciate your feedback. Please fill out the short survey that you will receive by mail or MyChart. Your opinion is important to [place]! ?- Dr. Kenton Kingfisher ? ?Estradiol Tablets ?What is this medication? ?ESTRADIOL (es tra DYE ole) reduces the number and severity of hot flashes due to menopause. It may also help relieve the symptoms of menopause, such as vaginal irritation, dryness, or pain during sex. It can be used to prevent osteoporosis after menopause. It is also used to reduce the symptoms of late-stage breast or prostate cancer. It works by increasing levels of the hormone estrogen in the body. This medication is an estrogen hormone. ?This medicine may be used for other purposes; ask your health care provider or pharmacist if you have questions. ?COMMON BRAND NAME(S): Estrace, Femtrace, Gynodiol ?What should I tell my care team before I take this medication? ?They need to know if you have or ever had any of these conditions: ?Abnormal vaginal bleeding ?Blood vessel disease or blood clots ?Breast, cervical, endometrial, ovarian, liver, or uterine cancer ?Dementia ?Diabetes ?Gallbladder disease ?Heart disease or recent heart attack ?High blood pressure ?High cholesterol ?High level of calcium in the blood ?Hysterectomy ?Kidney disease ?Liver disease ?Migraine headaches ?Protein C deficiency ?Protein S deficiency ?Stroke ?Systemic lupus erythematosus (SLE) ?Tobacco smoker ?An unusual or allergic reaction to estrogens, other hormones, medications, foods, dyes, or preservatives ?Pregnant or trying to get pregnant ?Breast-feeding ?How should I use this medication? ?Take this medication by mouth. To reduce nausea, this medication may be taken with food. Follow the  directions on the prescription label. Take this medication at the same time each day and in the order directed on the package. Do not take your medication more often than directed. ?Contact your care team regarding the use of this medication in children. Special care may be needed. ?A patient package insert for the product will be given with each prescription and refill. Read this sheet carefully each time. The sheet may change frequently. ?Overdosage: If you think you have taken too much of this medicine contact a poison control center or emergency room at once. ?NOTE: This medicine is only for you. Do not share this medicine with others. ?What if I miss a dose? ?If you miss a dose, take it as soon as you can. If it is almost time for your next dose, take only that dose. Do not take double or extra doses. ?What may interact with this medication? ?Do not take this medication with any of the following: ?Aromatase inhibitors like aminoglutethimide, anastrozole, exemestane, letrozole, testolactone ?This medication may also interact with the following: ?Carbamazepine ?Certain antibiotics used to treat infections ?Certain barbiturates or benzodiazepines used for inducing sleep or treating seizures ?Grapefruit juice ?Medications for fungus infections like itraconazole and ketoconazole ?Raloxifene or tamoxifen ?Rifabutin, rifampin, or rifapentine ?Ritonavir ?[place] ?Warfarin ?This list may not describe all possible interactions. Give your health care provider a list of all the medicines, herbs, non-prescription drugs, or dietary supplements you use. Also tell them if you smoke, drink alcohol, or use illegal drugs. Some items may interact with your medicine. ?What should I watch for while using this medication? ?Visit your care team for regular checks on your progress. You will need a regular breast and pelvic  exam and Pap smear while on this medication. You should also discuss the need for regular mammograms with  your care team, and follow their guidelines for these tests. ?This medication can make your body retain fluid, making your fingers, hands, or ankles swell. Your blood pressure can go up. Contact your care team if you feel you are retaining fluid. ?If you have any reason to think you are pregnant, stop taking this medication right away and contact your care team. ?Smoking increases the risk of getting a blood clot or having a stroke while you are taking this medication, especially if you are more than 48 years old. You are strongly advised not to smoke. ?If you wear contact lenses and notice visual changes, or if the lenses begin to feel uncomfortable, consult your eye care specialists. ?This medication can increase the risk of developing a condition (endometrial hyperplasia) that may lead to cancer of the lining of the uterus. Taking progestins, another hormone medication, with this medication lowers the risk of developing this condition. Therefore, if your uterus has not been removed (by a hysterectomy), your care team may prescribe a progestin for you to take together with your estrogen. You should know, however, that taking estrogens with progestins may have additional health risks. You should discuss the use of estrogens and progestins with your care team to determine the benefits and risks for you. ?If you are going to have surgery, you may need to stop taking this medication. Consult your care team for advice before you schedule the surgery. ?What side effects may I notice from receiving this medication? ?Side effects that you should report to your care team as soon as possible: ?Allergic reactions--skin rash, itching, hives, swelling of the face, lips, tongue, or throat ?Blood clot--pain, swelling, or warmth in the leg, shortness of breath, chest pain ?Breast tissue changes, new lumps, redness, pain, or discharge from the nipple ?Gallbladder problems--severe stomach pain, nausea, vomiting, fever ?Increase in  blood pressure ?Liver injury--right upper belly pain, loss of appetite, nausea, light-colored stool, dark yellow or brown urine, yellowing skin or eyes, unusual weakness or fatigue ?Stroke--sudden numbness or weakness of the face, arm, or leg, trouble speaking, confusion, trouble walking, loss of balance or coordination, dizziness, severe headache, change in vision ?Unusual vaginal discharge, itching, or odor ?Vaginal bleeding after menopause, pelvic pain ?Side effects that usually do not require medical attention (report to your care team if they continue or are bothersome): ?Bloating ?Breast pain or tenderness ?Hair loss ?Nausea ?Stomach pain ?Vomiting ?This list may not describe all possible side effects. Call your doctor for medical advice about side effects. You may report side effects to FDA at 1-800-FDA-1088. ?Where should I keep my medication? ?Keep out of the reach of children and pets. ?Store at room temperature between 20 and 25 degrees C (68 and 77 degrees F). Keep container tightly closed. Protect from light. Throw away any unused medication after the expiration date. ?NOTE: This sheet is a summary. It may not cover all possible information. If you have questions about this medicine, talk to your doctor, pharmacist, or health care provider. ?? 2022 Elsevier/Gold Standard (2020-06-25 00:00:00) ? ?

## 2021-08-31 LAB — HEP, RPR, HIV PANEL
HIV Screen 4th Generation wRfx: NONREACTIVE
Hepatitis B Surface Ag: NEGATIVE
RPR Ser Ql: NONREACTIVE

## 2021-09-02 LAB — CYTOLOGY - PAP
Chlamydia: NEGATIVE
Comment: NEGATIVE
Comment: NEGATIVE
Comment: NORMAL
Diagnosis: NEGATIVE
Neisseria Gonorrhea: NEGATIVE
Trichomonas: NEGATIVE

## 2021-09-14 ENCOUNTER — Ambulatory Visit (INDEPENDENT_AMBULATORY_CARE_PROVIDER_SITE_OTHER): Payer: Medicaid Other | Admitting: Clinical

## 2021-09-14 DIAGNOSIS — F4310 Post-traumatic stress disorder, unspecified: Secondary | ICD-10-CM | POA: Diagnosis not present

## 2021-09-14 DIAGNOSIS — F419 Anxiety disorder, unspecified: Secondary | ICD-10-CM | POA: Diagnosis not present

## 2021-09-14 DIAGNOSIS — F331 Major depressive disorder, recurrent, moderate: Secondary | ICD-10-CM | POA: Diagnosis not present
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
## 2021-10-21 ENCOUNTER — Ambulatory Visit (INDEPENDENT_AMBULATORY_CARE_PROVIDER_SITE_OTHER): Payer: Medicaid Other | Admitting: Clinical

## 2021-10-21 DIAGNOSIS — F419 Anxiety disorder, unspecified: Secondary | ICD-10-CM | POA: Diagnosis not present

## 2021-10-21 DIAGNOSIS — F331 Major depressive disorder, recurrent, moderate: Secondary | ICD-10-CM

## 2021-10-21 DIAGNOSIS — F4310 Post-traumatic stress disorder, unspecified: Secondary | ICD-10-CM

## 2021-10-21 NOTE — Progress Notes (Signed)
Virtual Visit via Telephone Note ?  ?I connected with Linda Cochran on 10/21/21 at 11:00 AM EST by telephone and verified that I am speaking with the correct person using two identifiers. ?  ?Location: ?Patient: Home ?Provider: Office ?  ?I discussed the limitations, risks, security and privacy concerns of performing an evaluation and management service by telephone and the availability of in person appointments. I also discussed with the patient that there may be a patient responsible charge related to this service. The patient expressed understanding and agreed to proceed. ?  ?  ?THERAPIST PROGRESS NOTE ?  ?Session Time: 11:00 AM-11:55 AM ?  ?Participation Level: Active ?  ?Behavioral Response: CasualAlertDepressed ?  ?Type of Therapy: Individual Therapy ?  ?Treatment Goals addressed: Anger and Coping ?  ?Interventions: CBT, Motivational Interviewing, Solution Focused and Strength-based ?  ?Summary: Linda Cochran is a 46 y.o. female who presents with Depression Anxiety and PTSD. The OPT therapist worked with the patient for her ongoing OPT treatment. The OPT therapist utilized Motivational Interviewing to assist in creating therapeutic repore. The patient in the session was engaged and work in collaboration giving feedback about her triggers and symptoms over the past few weeks. The patient spoke about getting prepared for her kids graduation and preparing for upcoming changes with moving. The patient spoke doing a medical procedure this past Monday doing a double ablation for back pain.The patient spoke about planning for the upcoming weekend to work on removing carpet and putting in new flooring in preparation for moving into her new living environment with her partners. The OPT therapist utilized Cognitive Behavioral Therapy through cognitive restructuring as well as worked with the patient on coping strategies to assist in management of mood. The OPT therapist worked in the session with the patient on challenging  negative thoughts and implementing positive thinking and working within her limits stay healthy and active. The OPT therapist worked with the patient overiewing her basic health areas of sleep/eating/physical exercise/ and hygiene.  ?  ?Suicidal/Homicidal: Nowithout intent/plan ?  ?Therapist Response: The OPT therapist worked with the patient for the patients scheduled session. The patient was engaged in her session and gave feedback in relation to triggers, symptoms, and behavior responses over the past few weeks. The patient spoke about the impact of multiple external factors including children becoming adults and their upcoming school graduation and her getting resettled with her new living arrangement with her partners.The OPT therapist worked with the patient utilizing an in session Cognitive Behavioral Therapy exercise. The OPT therapist worked with the patient about being mindful of her emotions and mood and doing self checkins throughout her week and implementing coping as needed to keep the patient at her mood baseline. The OPT therapist worked with the patient on self care focus.The OPT therapist reviewed the importance of continuing to be consistent with all recommendations and scheduled health appointments.The patient spoke about recovery from the double ablation and noted so far she has been in pain, but within her trial period  for 7-10 days to see ultimately how effective the procedure works. ?  ?Plan: Return again in 2/3 weeks. ?  ?Diagnosis:      Axis I: Depression/Anxiety/and PTSD  ?                        Axis II: No diagnosis ?  ?  ?Collaboration of Care: No additional collaboration of care for this session. ?  ?Patient/Guardian was advised Release of Information  must be obtained prior to any record release in order to collaborate their care with an outside provider. Patient/Guardian was advised if they have not already done so to contact the registration department to sign all necessary forms in  order for [place] to release information regarding their care.  ?  ?Consent: Patient/Guardian gives verbal consent for treatment and assignment of benefits for services provided during this visit. Patient/Guardian expressed understanding and agreed to proceed.  ?  ?  ?I discussed the assessment and treatment plan with the patient. The patient was provided an opportunity to ask questions and all were answered. The patient agreed with the plan and demonstrated an understanding of the instructions. ?  ?The patient was advised to call back or seek an in-person evaluation if the symptoms worsen or if the condition fails to improve as anticipated. ?  ?I provided 55 minutes of non-face-to-face time during this encounter. ?  ?Loriana Samad T. Eulas Post, LCSW ?  ?10/21/2021 ?

## 2021-11-21 ENCOUNTER — Ambulatory Visit: Payer: Medicaid Other | Admitting: Family Medicine

## 2021-11-25 ENCOUNTER — Encounter: Payer: Self-pay | Admitting: Family Medicine

## 2021-11-25 ENCOUNTER — Ambulatory Visit (INDEPENDENT_AMBULATORY_CARE_PROVIDER_SITE_OTHER): Payer: Medicaid Other | Admitting: Family Medicine

## 2021-11-25 VITALS — BP 122/90 | HR 90 | Temp 98.3°F | Resp 18 | Ht 65.5 in | Wt 194.8 lb

## 2021-11-25 DIAGNOSIS — J4540 Moderate persistent asthma, uncomplicated: Secondary | ICD-10-CM

## 2021-11-25 DIAGNOSIS — J3089 Other allergic rhinitis: Secondary | ICD-10-CM | POA: Diagnosis not present

## 2021-11-25 DIAGNOSIS — J302 Other seasonal allergic rhinitis: Secondary | ICD-10-CM

## 2021-11-25 DIAGNOSIS — K219 Gastro-esophageal reflux disease without esophagitis: Secondary | ICD-10-CM

## 2021-11-25 DIAGNOSIS — R040 Epistaxis: Secondary | ICD-10-CM

## 2021-11-25 DIAGNOSIS — H1010 Acute atopic conjunctivitis, unspecified eye: Secondary | ICD-10-CM

## 2021-11-25 DIAGNOSIS — Z91038 Other insect allergy status: Secondary | ICD-10-CM

## 2021-11-25 MED ORDER — OMEPRAZOLE 40 MG PO CPDR: 40.0000 mg | DELAYED_RELEASE_CAPSULE | Freq: Every day | ORAL | 5 refills | Status: DC

## 2021-11-25 NOTE — Patient Instructions (Addendum)
Asthma Continue Symbicort 80-2 puffs twice a day with a spacer to prevent cough or wheeze Continue albuterol 2 puffs once every 4 hours as needed for cough or wheeze You may use albuterol 2 puffs 5 to 15 minutes before activity to decrease cough or wheeze You do qualify for a biologic medication for asthma management if necessary. Written information provided  Allergic rhinitis Continue allergen avoidance measures directed toward grass pollen, weed pollen, tree pollen, mold, cat, dog, dust mite, and cockroach as listed below Continue carbinoxamine 4 mg tablets once every 6-8 hours as needed for a runny nose or itch Continue Nasacort 2 sprays in each nostril once a day as needed for stuffy nose Consider saline nasal rinses as needed for nasal symptoms. Use this before any medicated nasal sprays for best result Continue azelastine 2 sprays in each nostril up to twice a day as needed for a runny nose Consider nasal saline gel as needed for dry nostrils If your symptoms of allergic rhinitis are not well controlled with the treatment plan as listed above, consider allergen immunotherapy. Written information has been provided.   Allergic conjunctivitis Some over the counter eye drops include Pataday one drop in each eye once a day as needed for red, itchy eyes OR Zaditor one drop in each eye twice a day as needed for red itchy eyes.  Reflux Continue dietary and lifestyle modifications as listed below Restart omeprazole 40 mg once a day as previously prescribed to prevent reflux  Epistaxis Pinch both nostrils while leaning forward for at least 5 minutes before checking to see if the bleeding has stopped. If bleeding is not controlled within 5-10 minutes apply a cotton ball soaked with oxymetazoline (Afrin) to the bleeding nostril for a few seconds.  If the problem persists or worsens a referral to ENT for further evaluation may be necessary.  Stinging insect allergy Continue to avoid stinging  insects.  In case of an allergic reaction, take Benadryl 50 mg every 4 hours, and if life-threatening symptoms occur, inject with EpiPen 0.3 mg. A lab order has been placed to help [place] assess your stinging insect allergy.  We will call you when the result becomes available  Call the clinic if this treatment plan is not working well for you.  Follow up in the clinic in 6 months or sooner if needed.  Reducing Pollen Exposure The American Academy of Allergy, Asthma and Immunology suggests the following steps to reduce your exposure to pollen during allergy seasons. Do not hang sheets or clothing out to dry; pollen may collect on these items. Do not mow lawns or spend time around freshly cut grass; mowing stirs up pollen. Keep windows closed at night.  Keep car windows closed while driving. Minimize morning activities outdoors, a time when pollen counts are usually at their highest. Stay indoors as much as possible when pollen counts or humidity is high and on windy days when pollen tends to remain in the air longer. Use air conditioning when possible.  Many air conditioners have filters that trap the pollen spores. Use a HEPA room air filter to remove pollen form the indoor air you breathe.  Control of Mold Allergen Mold and fungi can grow on a variety of surfaces provided certain temperature and moisture conditions exist.  Outdoor molds grow on plants, decaying vegetation and soil.  The major outdoor mold, Alternaria and Cladosporium, are found in very high numbers during hot and dry conditions.  Generally, a late Summer - Fall peak  is seen for common outdoor fungal spores.  Rain will temporarily lower outdoor mold spore count, but counts rise rapidly when the rainy period ends.  The most important indoor molds are Aspergillus and Penicillium.  Dark, humid and poorly ventilated basements are ideal sites for mold growth.  The next most common sites of mold growth are the bathroom and the  kitchen.  Outdoor [employer] Use air conditioning and keep windows closed Avoid exposure to decaying vegetation. Avoid leaf raking. Avoid grain handling. Consider wearing a face mask if working in moldy areas.  Indoor Mold Control Maintain humidity below 50%. Clean washable surfaces with 5% bleach solution. Remove sources e.g. Contaminated carpets.   Control of Dust Mite Allergen Dust mites play a major role in allergic asthma and rhinitis. They occur in environments with high humidity wherever human skin is found. Dust mites absorb humidity from the atmosphere (ie, they do not drink) and feed on organic matter (including shed human and animal skin). Dust mites are a microscopic type of insect that you cannot see with the naked eye. High levels of dust mites have been detected from mattresses, pillows, carpets, upholstered furniture, bed covers, clothes, soft toys and any woven material. The principal allergen of the dust mite is found in its feces. A gram of dust may contain 1,000 mites and 250,000 fecal particles. Mite antigen is easily measured in the air during house cleaning activities. Dust mites do not bite and do not cause harm to humans, other than by triggering allergies/asthma.  Ways to decrease your exposure to dust mites in your home:  1. Encase mattresses, box springs and pillows with a mite-impermeable barrier or cover  2. Wash sheets, blankets and drapes weekly in hot water (130 F) with detergent and dry them in a dryer on the hot setting.  3. Have the room cleaned frequently with a vacuum cleaner and a damp dust-mop. For carpeting or rugs, vacuuming with a vacuum cleaner equipped with a high-efficiency particulate air (HEPA) filter. The dust mite allergic individual should not be in a room which is being cleaned and should wait 1 hour after cleaning before going into the room.  4. Do not sleep on upholstered furniture (eg, couches).  5. If possible removing carpeting,  upholstered furniture and drapery from the home is ideal. Horizontal blinds should be eliminated in the rooms where the person spends the most time (bedroom, study, television room). Washable vinyl, roller-type shades are optimal.  6. Remove all non-washable stuffed toys from the bedroom. Wash stuffed toys weekly like sheets and blankets above.  7. Reduce indoor humidity to less than 50%. Inexpensive humidity monitors can be purchased at most hardware stores. Do not use a humidifier as can make the problem worse and are not recommended.  Control of Dog or Cat Allergen Avoidance is the best way to manage a dog or cat allergy. If you have a dog or cat and are allergic to dog or cats, consider removing the dog or cat from the home. If you have a dog or cat but don't want to find it a new home, or if your family wants a pet even though someone in the household is allergic, here are some strategies that may help keep symptoms at bay:  Keep the pet out of your bedroom and restrict it to only a few rooms. Be advised that keeping the dog or cat in only one room will not limit the allergens to that room. Don't pet, hug or kiss  the dog or cat; if you do, wash your hands with soap and water. High-efficiency particulate air (HEPA) cleaners run continuously in a bedroom or living room can reduce allergen levels over time. Regular use of a high-efficiency vacuum cleaner or a central vacuum can reduce allergen levels. Giving your dog or cat a bath at least once a week can reduce airborne allergen.  Control of Cockroach Allergen Cockroach allergen has been identified as an important cause of acute attacks of asthma, especially in urban settings.  There are fifty-five species of cockroach that exist in the [place], however only three, the [place], [place] species produce allergen that can affect patients with Asthma.  Allergens can be obtained from fecal particles, egg casings and secretions from  cockroaches.    Remove food sources. Reduce access to water. Seal access and entry points. Spray runways with 0.5-1% Diazinon or Chlorpyrifos Blow boric acid power under stoves and refrigerator. Place bait stations (hydramethylnon) at feeding sites.

## 2021-11-25 NOTE — Progress Notes (Signed)
[address], [address] [place] [address] Dept: [phone]  FOLLOW UP NOTE  Patient ID: Linda Cochran, female    DOB: 14-Nov-1975  Age: 46 y.o. MRN: [id number] Date of Office Visit: 11/25/2021  Assessment  Chief Complaint: Asthma (3 mth f/u - Doing good), Allergic Rhinits (3 mth f/u - Pretty good), and Gastroesophageal Reflux (3 mth f/u - Rough - really bad. Patient states she never received Rx Omeprazole.)  HPI Linda Cochran is a 46 female who presents to the clinic for follow-up visit.  She was last seen in this clinic on 08/29/2021 by Webb Silversmith, [place], for evaluation of asthma, allergic rhinitis, allergic conjunctivitis, reflux, allergy to insect sting, and epistaxis.  At today's visit, she reports her asthma has been moderately well controlled with symptoms including occasional shortness of breath with activity and cough occurring mainly at nighttime.  She continues montelukast 10 mg once a day, Symbicort 80-2 puffs twice a day with a spacer, and has used albuterol 3 times since her last visit with relief of symptoms.  Allergic rhinitis is reported as moderately well controlled with symptoms including moderate nasal congestion and occasional postnasal drainage most frequently occurring at nighttime.  She continues carbinoxamine 1 tablet in the morning and occasionally 1 tablet in the evening as well as azelastine twice a day.  She is not currently using nasal saline rinses. Her last environmental allergy skin testing was on 07/15/2021 and was positive to grass pollen, weed pollen, tree pollen, mold, cat, dog, dust mite, and cockroach.  Allergic conjunctivitis is reported as moderately well controlled with occasional red and itchy eyes for which she uses an over-the-counter allergy eyedrop with relief of symptoms.  Reflux is reported as poorly controlled with heartburn occurring 4 to 5 days a week.  She has previously taken omeprazole, however, is not currently taking any medication to control reflux.   She reports insect stings occurring on 2 separate occasions both requiring hospitalization for cellulitis as well as throat closing.  She has not had any insect stings since her last visit to this clinic.  Her EpiPen's are up-to-date.  She denies recent epistaxis.  Her current medications are listed in the chart.  Drug Allergies:  Allergies  Allergen Reactions   Methylprednisolone Other (See Comments)    Paralytic reaction   Codeine Hives and Itching   Dilaudid [Hydromorphone Hcl] Hives and Itching   Morphine And Related Hives and Itching   Pantoprazole Sodium Diarrhea and Nausea Only    Stomach cramps   Penicillins Hives   Tramadol Diarrhea    Diarrhea (GI UPSET)    Physical Exam: BP 122/90   Pulse 90   Temp 98.3 F (36.8 C)   Resp 18   Ht 5' 5.5" (1.664 m)   Wt 194 lb 12.8 oz (88.4 kg)   SpO2 96%   BMI 31.92 kg/m    Physical Exam Vitals reviewed.  Constitutional:      Appearance: Normal appearance.  HENT:     Head: Normocephalic and atraumatic.     Right Ear: Tympanic membrane normal.     Left Ear: Tympanic membrane normal.     Nose:     Comments: Bilateral nares slightly erythematous with clear nasal drainage noted.  Pharynx normal.  Ears normal.  Eyes normal.    Mouth/Throat:     Pharynx: Oropharynx is clear.  Eyes:     Conjunctiva/sclera: Conjunctivae normal.  Cardiovascular:     Rate and Rhythm: Normal rate and regular rhythm.  Heart sounds: Normal heart sounds. No murmur heard. Pulmonary:     Effort: Pulmonary effort is normal.     Breath sounds: Normal breath sounds.     Comments: Lungs clear to auscultation Musculoskeletal:        General: Normal range of motion.     Cervical back: Normal range of motion and neck supple.  Skin:    General: Skin is warm and dry.  Neurological:     Mental Status: She is alert and oriented to person, place, and time.  Psychiatric:        Mood and Affect: Mood normal.        Behavior: Behavior normal.        Thought  Content: Thought content normal.        Judgment: Judgment normal.     Diagnostics: FVC 2.92, FEV1 2.34.  Predicted FVC 3.63, predicted FEV1 2.91.  Spirometry indicates normal ventilatory function.  Assessment and Plan: 1. Moderate persistent asthma, uncomplicated   2. Seasonal and perennial allergic rhinitis   3. Seasonal allergic conjunctivitis   4. Gastroesophageal reflux disease, unspecified whether esophagitis present   5. Epistaxis   6. Hymenoptera allergy     Meds ordered this encounter  Medications   omeprazole (PRILOSEC) 40 MG capsule    Sig: Take 1 capsule (40 mg total) by mouth daily.    Dispense:  30 capsule    Refill:  5    Patient Instructions  Asthma Continue Symbicort 80-2 puffs twice a day with a spacer to prevent cough or wheeze Continue albuterol 2 puffs once every 4 hours as needed for cough or wheeze You may use albuterol 2 puffs 5 to 15 minutes before activity to decrease cough or wheeze You do qualify for a biologic medication for asthma management if necessary. Written information provided  Allergic rhinitis Continue allergen avoidance measures directed toward grass pollen, weed pollen, tree pollen, mold, cat, dog, dust mite, and cockroach as listed below Continue carbinoxamine 4 mg tablets once every 6-8 hours as needed for a runny nose or itch Continue Nasacort 2 sprays in each nostril once a day as needed for stuffy nose Consider saline nasal rinses as needed for nasal symptoms. Use this before any medicated nasal sprays for best result Continue azelastine 2 sprays in each nostril up to twice a day as needed for a runny nose Consider nasal saline gel as needed for dry nostrils If your symptoms of allergic rhinitis are not well controlled with the treatment plan as listed above, consider allergen immunotherapy. Written information has been provided.   Allergic conjunctivitis Some over the counter eye drops include Pataday one drop in each eye once a  day as needed for red, itchy eyes OR Zaditor one drop in each eye twice a day as needed for red itchy eyes.  Reflux Continue dietary and lifestyle modifications as listed below Restart omeprazole 40 mg once a day as previously prescribed to prevent reflux  Epistaxis Pinch both nostrils while leaning forward for at least 5 minutes before checking to see if the bleeding has stopped. If bleeding is not controlled within 5-10 minutes apply a cotton ball soaked with oxymetazoline (Afrin) to the bleeding nostril for a few seconds.  If the problem persists or worsens a referral to ENT for further evaluation may be necessary.  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  In case of an allergic reaction, take Benadryl 50 mg every 4 hours, and if life-threatening symptoms occur, inject with  EpiPen 0.3 mg. A lab order has been placed to help [place] assess your stinging insect allergy.  We will call you when the result becomes available  Call the clinic if this treatment plan is not working well for you.  Follow up in the clinic in 6 months or sooner if needed.   Return in about 6 months (around 05/27/2022), or if symptoms worsen or fail to improve.    Thank you for the opportunity to care for this patient.  Please do not hesitate to contact me with questions.  Gareth Morgan, FNP [facility]

## 2021-11-30 ENCOUNTER — Ambulatory Visit: Payer: Medicaid Other | Admitting: Family Medicine

## 2022-02-24 ENCOUNTER — Other Ambulatory Visit: Payer: Self-pay | Admitting: Orthopedic Surgery

## 2022-02-24 DIAGNOSIS — M25561 Pain in right knee: Secondary | ICD-10-CM

## 2022-02-24 DIAGNOSIS — M23303 Other meniscus derangements, unspecified medial meniscus, right knee: Secondary | ICD-10-CM

## 2022-03-10 ENCOUNTER — Other Ambulatory Visit: Payer: Medicaid Other

## 2022-03-19 ENCOUNTER — Ambulatory Visit
Admission: RE | Admit: 2022-03-19 | Discharge: 2022-03-19 | Disposition: A | Discharge status: Home / Self Care | Payer: Medicaid Other | Source: Ambulatory Visit | Attending: Orthopedic Surgery | Admitting: Orthopedic Surgery

## 2022-03-19 DIAGNOSIS — M25561 Pain in right knee: Secondary | ICD-10-CM

## 2022-03-19 DIAGNOSIS — M23303 Other meniscus derangements, unspecified medial meniscus, right knee: Secondary | ICD-10-CM

## 2022-05-13 ENCOUNTER — Other Ambulatory Visit: Payer: Self-pay | Admitting: Family Medicine

## 2022-05-26 ENCOUNTER — Ambulatory Visit: Payer: Medicaid Other | Admitting: Allergy & Immunology

## 2022-05-30 NOTE — Patient Instructions (Incomplete)
Asthma Increase Symbicort 80/4.5 mcg-2 puffs twice a day with a spacer to prevent cough or wheeze. Spacer sent as prescription to pharmacy. Demonstration given Continue albuterol 2 puffs once every 4 hours as needed for cough or wheeze You may use albuterol 2 puffs 5 to 15 minutes before activity to decrease cough or wheeze You do qualify for a biologic medication for asthma management if necessary.  Asthma control goals:  Full participation in all desired activities (may need albuterol before activity) Albuterol use two time or less a week on average (not counting use with activity) Cough interfering with sleep two time or less a month Oral steroids no more than once a year No hospitalizations  Allergic rhinitis Continue allergen avoidance measures directed toward grass pollen, weed pollen, tree pollen, mold, cat, dog, dust mite, and cockroach as listed below Continue carbinoxamine 4 mg tablets once every 6-8 hours as needed for a runny nose or itch Continue Nasacort 2 sprays in each nostril once a day as needed for stuffy nose. Hold off on using Nasacort for a few days while there is irritation in your left nostril Consider saline nasal rinses as needed for nasal symptoms. Use this before any medicated nasal sprays for best result Continue azelastine 2 sprays in each nostril up to twice a day as needed for a runny nose Consider nasal saline gel as needed for dry nostrils If your symptoms of allergic rhinitis are not well controlled with the treatment plan as listed above, consider allergen immunotherapy.   Allergic conjunctivitis Some over the counter eye drops include Pataday one drop in each eye once a day as needed for red, itchy eyes OR Zaditor one drop in each eye twice a day as needed for red itchy eyes.  Reflux Continue dietary and lifestyle modifications as listed below Continue omeprazole 40 mg once a day as previously prescribed to prevent reflux I will refer you back to GI  due to poorly controlled reflux, history of hiatal hernia, and sensation of medications getting stuck  Epistaxis Pinch both nostrils while leaning forward for at least 5 minutes before checking to see if the bleeding has stopped. If bleeding is not controlled within 5-10 minutes apply a cotton ball soaked with oxymetazoline (Afrin) to the bleeding nostril for a few seconds.  If the problem persists or worsens a referral to ENT for further evaluation may be necessary.  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  In case of an allergic reaction, take Benadryl 50 mg every 4 hours, and if life-threatening symptoms occur, inject with EpiPen 0.3 mg. A lab order was placed at your last office visit to help [place] assess your stinging insect allergy. Please get this done as soon as possible.  We will call you when the result becomes available. Lab corp sheet with locations given.  Call the clinic if this treatment plan is not working well for you.  Follow up in the clinic in 6 weeks or sooner if needed.  Reducing Pollen Exposure The American Academy of Allergy, Asthma and Immunology suggests the following steps to reduce your exposure to pollen during allergy seasons. Do not hang sheets or clothing out to dry; pollen may collect on these items. Do not mow lawns or spend time around freshly cut grass; mowing stirs up pollen. Keep windows closed at night.  Keep car windows closed while driving. Minimize morning activities outdoors, a time when pollen counts are usually at their highest. Stay indoors as much as possible when  pollen counts or humidity is high and on windy days when pollen tends to remain in the air longer. Use air conditioning when possible.  Many air conditioners have filters that trap the pollen spores. Use a HEPA room air filter to remove pollen form the indoor air you breathe.  Control of Mold Allergen Mold and fungi can grow on a variety of surfaces provided certain  temperature and moisture conditions exist.  Outdoor molds grow on plants, decaying vegetation and soil.  The major outdoor mold, Alternaria and Cladosporium, are found in very high numbers during hot and dry conditions.  Generally, a late Summer - Fall peak is seen for common outdoor fungal spores.  Rain will temporarily lower outdoor mold spore count, but counts rise rapidly when the rainy period ends.  The most important indoor molds are Aspergillus and Penicillium.  Dark, humid and poorly ventilated basements are ideal sites for mold growth.  The next most common sites of mold growth are the bathroom and the kitchen.  Outdoor [employer] Use air conditioning and keep windows closed Avoid exposure to decaying vegetation. Avoid leaf raking. Avoid grain handling. Consider wearing a face mask if working in moldy areas.  Indoor Mold Control Maintain humidity below 50%. Clean washable surfaces with 5% bleach solution. Remove sources e.g. Contaminated carpets.   Control of Dust Mite Allergen Dust mites play a major role in allergic asthma and rhinitis. They occur in environments with high humidity wherever human skin is found. Dust mites absorb humidity from the atmosphere (ie, they do not drink) and feed on organic matter (including shed human and animal skin). Dust mites are a microscopic type of insect that you cannot see with the naked eye. High levels of dust mites have been detected from mattresses, pillows, carpets, upholstered furniture, bed covers, clothes, soft toys and any woven material. The principal allergen of the dust mite is found in its feces. A gram of dust may contain 1,000 mites and 250,000 fecal particles. Mite antigen is easily measured in the air during house cleaning activities. Dust mites do not bite and do not cause harm to humans, other than by triggering allergies/asthma.  Ways to decrease your exposure to dust mites in your home:  1. Encase mattresses, box springs and  pillows with a mite-impermeable barrier or cover  2. Wash sheets, blankets and drapes weekly in hot water (130 F) with detergent and dry them in a dryer on the hot setting.  3. Have the room cleaned frequently with a vacuum cleaner and a damp dust-mop. For carpeting or rugs, vacuuming with a vacuum cleaner equipped with a high-efficiency particulate air (HEPA) filter. The dust mite allergic individual should not be in a room which is being cleaned and should wait 1 hour after cleaning before going into the room.  4. Do not sleep on upholstered furniture (eg, couches).  5. If possible removing carpeting, upholstered furniture and drapery from the home is ideal. Horizontal blinds should be eliminated in the rooms where the person spends the most time (bedroom, study, television room). Washable vinyl, roller-type shades are optimal.  6. Remove all non-washable stuffed toys from the bedroom. Wash stuffed toys weekly like sheets and blankets above.  7. Reduce indoor humidity to less than 50%. Inexpensive humidity monitors can be purchased at most hardware stores. Do not use a humidifier as can make the problem worse and are not recommended.  Control of Dog or Cat Allergen Avoidance is the best way to manage a dog  or cat allergy. If you have a dog or cat and are allergic to dog or cats, consider removing the dog or cat from the home. If you have a dog or cat but don't want to find it a new home, or if your family wants a pet even though someone in the household is allergic, here are some strategies that may help keep symptoms at bay:  Keep the pet out of your bedroom and restrict it to only a few rooms. Be advised that keeping the dog or cat in only one room will not limit the allergens to that room. Don't pet, hug or kiss the dog or cat; if you do, wash your hands with soap and water. High-efficiency particulate air (HEPA) cleaners run continuously in a bedroom or living room can reduce allergen  levels over time. Regular use of a high-efficiency vacuum cleaner or a central vacuum can reduce allergen levels. Giving your dog or cat a bath at least once a week can reduce airborne allergen.  Control of Cockroach Allergen Cockroach allergen has been identified as an important cause of acute attacks of asthma, especially in urban settings.  There are fifty-five species of cockroach that exist in the [place], however only three, the [place], [place] species produce allergen that can affect patients with Asthma.  Allergens can be obtained from fecal particles, egg casings and secretions from cockroaches.    Remove food sources. Reduce access to water. Seal access and entry points. Spray runways with 0.5-1% Diazinon or Chlorpyrifos Blow boric acid power under stoves and refrigerator. Place bait stations (hydramethylnon) at feeding sites.

## 2022-05-31 ENCOUNTER — Other Ambulatory Visit: Payer: Self-pay

## 2022-05-31 ENCOUNTER — Encounter: Payer: Self-pay | Admitting: Family

## 2022-05-31 ENCOUNTER — Ambulatory Visit (INDEPENDENT_AMBULATORY_CARE_PROVIDER_SITE_OTHER): Payer: Medicaid Other | Admitting: Family

## 2022-05-31 VITALS — BP 116/86 | HR 107 | Temp 97.9°F | Resp 18

## 2022-05-31 DIAGNOSIS — R040 Epistaxis: Secondary | ICD-10-CM

## 2022-05-31 DIAGNOSIS — K219 Gastro-esophageal reflux disease without esophagitis: Secondary | ICD-10-CM

## 2022-05-31 DIAGNOSIS — J3089 Other allergic rhinitis: Secondary | ICD-10-CM | POA: Diagnosis not present

## 2022-05-31 DIAGNOSIS — H1013 Acute atopic conjunctivitis, bilateral: Secondary | ICD-10-CM

## 2022-05-31 DIAGNOSIS — H1010 Acute atopic conjunctivitis, unspecified eye: Secondary | ICD-10-CM

## 2022-05-31 DIAGNOSIS — J302 Other seasonal allergic rhinitis: Secondary | ICD-10-CM

## 2022-05-31 DIAGNOSIS — Z91038 Other insect allergy status: Secondary | ICD-10-CM

## 2022-05-31 DIAGNOSIS — J4540 Moderate persistent asthma, uncomplicated: Secondary | ICD-10-CM

## 2022-05-31 MED ORDER — SPACER/AERO-HOLDING CHAMBERS DEVI: 1.0000 | 1 refills | Status: DC

## 2022-05-31 MED ORDER — OMEPRAZOLE 40 MG PO CPDR: 40.0000 mg | DELAYED_RELEASE_CAPSULE | Freq: Every day | ORAL | 3 refills | Status: DC

## 2022-05-31 MED ORDER — BUDESONIDE-FORMOTEROL FUMARATE 80-4.5 MCG/ACT IN AERO: 2.0000 | INHALATION_SPRAY | Freq: Two times a day (BID) | RESPIRATORY_TRACT | 5 refills | Status: DC

## 2022-05-31 MED ORDER — ALBUTEROL SULFATE HFA 108 (90 BASE) MCG/ACT IN AERS: INHALATION_SPRAY | RESPIRATORY_TRACT | 1 refills | Status: DC

## 2022-05-31 MED ORDER — AZELASTINE HCL 0.1 % NA SOLN: NASAL | 5 refills | Status: DC

## 2022-05-31 MED ORDER — EPIPEN 2-PAK 0.3 MG/0.3ML IJ SOAJ: 0.3000 mg | INTRAMUSCULAR | 1 refills | Status: DC

## 2022-05-31 NOTE — Progress Notes (Signed)
[address] [place] [address] Dept: [phone]  FOLLOW UP NOTE  Patient ID: Linda Cochran, female    DOB: 1975-09-26  Age: 46 y.o. MRN: [id number] Date of Office Visit: 05/31/2022  Assessment  Chief Complaint: Follow-up  HPI Linda Cochran is a 46 year old female who presents today for follow-up of moderate persistent asthma, seasonal and perennial allergic rhinitis, seasonal allergic conjunctivitis, gastroesophageal reflux disease, epistaxis, and hymenoptera allergy.  She was last seen on November 25, 2021 by Gareth Morgan, FNP.  She reports since her last office visit she has been seeing a therapist for PTSD and depression.  She is also started receiving Aimovig injections for migraines.  Moderate persistent asthma: She reports that she has had shortness of breath and a little bit of tightness in her chest since the weather has gotten cold.  She denies coughing, wheezing, and nocturnal awakenings due to breathing problems.  She is currently only using her Symbicort 80/4.5 mcg 2 puffs once a day without a spacer.  She reports 1/4 of the way through summer she was doing good so she cut back on her Symbicort to 2 puffs once a day.  Since her last office visit she has not required any systemic steroids or made any trips to the emergency room or urgent care due to breathing problems.  She has been using her albuterol approximately once every 3 days.  During the summer she did not need to use her albuterol at all.  Allergic rhinitis.  She reports really dry nose and mouth.  Her mouth feels like cotton.  She did recently use saline gel for her nose and it helped.  She is also had a couple nosebleeds.  She reports that she will get a couple nosebleeds during this time of the year.  She also has little bit of nasal congestion and postnasal drip.  She denies rhinorrhea.  She has not had any sinus infections since we last saw her.  She is really thinking about starting allergy injections.  She  just recently started back taking the carbinoxamine and is only taking 1 tablet once a day.  She also uses Nasacort nasal spray as needed and azelastine nasal spray as needed.  Allergic conjunctivitis.  She denies itchy watery eyes.  Reflux is reported as being really bad with omeprazole 40 mg once a day.  She reports that she has a history of hiatal hernia.  She also will at times feel like her medicine will get stuck when she takes it.  She also will wake up at times with burning acid in her throat.  She does not think that she is seen GI in the last 2 or 3 years.  Stinging insect allergy: She denies any insect stings or use of her epinephrine autoinjector device since her last office visit.   Drug Allergies:  Allergies  Allergen Reactions   Methylprednisolone Other (See Comments)    Paralytic reaction   Codeine Hives and Itching   Dilaudid [Hydromorphone Hcl] Hives and Itching   Morphine And Related Hives and Itching   Pantoprazole Sodium Diarrhea and Nausea Only    Stomach cramps   Penicillins Hives   Tramadol Diarrhea    Diarrhea (GI UPSET)    Review of Systems: Review of Systems  Constitutional:  Negative for chills and fever.  HENT:         Reports dry mouth, dry nose, a few nosebleeds, little nasal congestion, and some postnasal drip.  Denies rhinorrhea  Eyes:  Denies itchy watery eyes  Respiratory:  Positive for shortness of breath. Negative for cough and wheezing.        Reports shortness of breath and a little bit of tightness since the cold weather.  Denies coughing, wheezing, and nocturnal awakenings due to breathing problems.  Cardiovascular:  Negative for chest pain and palpitations.  Gastrointestinal:        Reports her reflux has been bad.  She also reports history of hiatal hernia and feeling like medications get stuck at times  Genitourinary:  Negative for frequency.  Skin:  Negative for itching and rash.  Neurological:  Positive for headaches.        Reports migraines for which she takes Aimovig and this has helped decrease the frequency  Endo/Heme/Allergies:  Positive for environmental allergies.     Physical Exam: BP 116/86 (BP Location: Left Arm, Patient Position: Sitting, Cuff Size: Normal)   Pulse (!) 107   Temp 97.9 F (36.6 C) (Temporal)   Resp 18   SpO2 97%    Physical Exam Constitutional:      Appearance: Normal appearance.  HENT:     Head: Normocephalic and atraumatic.     Comments: Pharynx normal, eyes normal, ears normal, nose: Irritation with slight scabbing noted in left nostril.  Bilateral lower turbinates mildly edematous and slightly erythematous with no drainage noted    Right Ear: Tympanic membrane, ear canal and external ear normal.     Left Ear: Tympanic membrane, ear canal and external ear normal.     Mouth/Throat:     Mouth: Mucous membranes are moist.     Pharynx: Oropharynx is clear.  Eyes:     Conjunctiva/sclera: Conjunctivae normal.  Cardiovascular:     Rate and Rhythm: Regular rhythm. Tachycardia present.     Heart sounds: Normal heart sounds.  Pulmonary:     Effort: Pulmonary effort is normal.     Breath sounds: Normal breath sounds.     Comments: Lungs clear to auscultation Musculoskeletal:     Cervical back: Neck supple.  Skin:    General: Skin is warm.  Neurological:     Mental Status: She is alert and oriented to person, place, and time.  Psychiatric:        Mood and Affect: Mood normal.        Behavior: Behavior normal.        Thought Content: Thought content normal.        Judgment: Judgment normal.    Diagnostics: FVC 2.90 L (80%).  Predicted FEV1 2.35 L (81%).  Predicted FVC 3.62 L, predicted FEV1 2.90 L.  Spirometry indicates normal respiratory function.   Assessment and Plan: 1. Not well controlled moderate persistent asthma   2. Seasonal and perennial allergic rhinitis   3. Gastroesophageal reflux disease, unspecified whether esophagitis present   4. Epistaxis   5.  Hymenoptera allergy   6. Seasonal allergic conjunctivitis     No orders of the defined types were placed in this encounter.   Patient Instructions  Asthma Increase Symbicort 80/4.5 mcg-2 puffs twice a day with a spacer to prevent cough or wheeze. Spacer sent as prescription to pharmacy. Demonstration given Continue albuterol 2 puffs once every 4 hours as needed for cough or wheeze You may use albuterol 2 puffs 5 to 15 minutes before activity to decrease cough or wheeze You do qualify for a biologic medication for asthma management if necessary.  Asthma control goals:  Full participation in all desired activities (may need  albuterol before activity) Albuterol use two time or less a week on average (not counting use with activity) Cough interfering with sleep two time or less a month Oral steroids no more than once a year No hospitalizations  Allergic rhinitis Continue allergen avoidance measures directed toward grass pollen, weed pollen, tree pollen, mold, cat, dog, dust mite, and cockroach as listed below Continue carbinoxamine 4 mg tablets once every 6-8 hours as needed for a runny nose or itch Continue Nasacort 2 sprays in each nostril once a day as needed for stuffy nose. Hold off on using Nasacort for a few days while there is irritation in your left nostril Consider saline nasal rinses as needed for nasal symptoms. Use this before any medicated nasal sprays for best result Continue azelastine 2 sprays in each nostril up to twice a day as needed for a runny nose Consider nasal saline gel as needed for dry nostrils If your symptoms of allergic rhinitis are not well controlled with the treatment plan as listed above, consider allergen immunotherapy.   Allergic conjunctivitis Some over the counter eye drops include Pataday one drop in each eye once a day as needed for red, itchy eyes OR Zaditor one drop in each eye twice a day as needed for red itchy eyes.  Reflux Continue dietary  and lifestyle modifications as listed below Continue omeprazole 40 mg once a day as previously prescribed to prevent reflux I will refer you back to GI due to poorly controlled reflux, history of hiatal hernia, and sensation of medications getting stuck  Epistaxis Pinch both nostrils while leaning forward for at least 5 minutes before checking to see if the bleeding has stopped. If bleeding is not controlled within 5-10 minutes apply a cotton ball soaked with oxymetazoline (Afrin) to the bleeding nostril for a few seconds.  If the problem persists or worsens a referral to ENT for further evaluation may be necessary.  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  In case of an allergic reaction, take Benadryl 50 mg every 4 hours, and if life-threatening symptoms occur, inject with EpiPen 0.3 mg. A lab order was placed at your last office visit to help [place] assess your stinging insect allergy. Please get this done as soon as possible.  We will call you when the result becomes available. Lab corp sheet with locations given.  Call the clinic if this treatment plan is not working well for you.  Follow up in the clinic in 6 weeks or sooner if needed.  Reducing Pollen Exposure The American Academy of Allergy, Asthma and Immunology suggests the following steps to reduce your exposure to pollen during allergy seasons. Do not hang sheets or clothing out to dry; pollen may collect on these items. Do not mow lawns or spend time around freshly cut grass; mowing stirs up pollen. Keep windows closed at night.  Keep car windows closed while driving. Minimize morning activities outdoors, a time when pollen counts are usually at their highest. Stay indoors as much as possible when pollen counts or humidity is high and on windy days when pollen tends to remain in the air longer. Use air conditioning when possible.  Many air conditioners have filters that trap the pollen spores. Use a HEPA room air  filter to remove pollen form the indoor air you breathe.  Control of Mold Allergen Mold and fungi can grow on a variety of surfaces provided certain temperature and moisture conditions exist.  Outdoor molds grow on plants, decaying vegetation  and soil.  The major outdoor mold, Alternaria and Cladosporium, are found in very high numbers during hot and dry conditions.  Generally, a late Summer - Fall peak is seen for common outdoor fungal spores.  Rain will temporarily lower outdoor mold spore count, but counts rise rapidly when the rainy period ends.  The most important indoor molds are Aspergillus and Penicillium.  Dark, humid and poorly ventilated basements are ideal sites for mold growth.  The next most common sites of mold growth are the bathroom and the kitchen.  Outdoor [employer] Use air conditioning and keep windows closed Avoid exposure to decaying vegetation. Avoid leaf raking. Avoid grain handling. Consider wearing a face mask if working in moldy areas.  Indoor Mold Control Maintain humidity below 50%. Clean washable surfaces with 5% bleach solution. Remove sources e.g. Contaminated carpets.   Control of Dust Mite Allergen Dust mites play a major role in allergic asthma and rhinitis. They occur in environments with high humidity wherever human skin is found. Dust mites absorb humidity from the atmosphere (ie, they do not drink) and feed on organic matter (including shed human and animal skin). Dust mites are a microscopic type of insect that you cannot see with the naked eye. High levels of dust mites have been detected from mattresses, pillows, carpets, upholstered furniture, bed covers, clothes, soft toys and any woven material. The principal allergen of the dust mite is found in its feces. A gram of dust may contain 1,000 mites and 250,000 fecal particles. Mite antigen is easily measured in the air during house cleaning activities. Dust mites do not bite and do not cause harm to  humans, other than by triggering allergies/asthma.  Ways to decrease your exposure to dust mites in your home:  1. Encase mattresses, box springs and pillows with a mite-impermeable barrier or cover  2. Wash sheets, blankets and drapes weekly in hot water (130 F) with detergent and dry them in a dryer on the hot setting.  3. Have the room cleaned frequently with a vacuum cleaner and a damp dust-mop. For carpeting or rugs, vacuuming with a vacuum cleaner equipped with a high-efficiency particulate air (HEPA) filter. The dust mite allergic individual should not be in a room which is being cleaned and should wait 1 hour after cleaning before going into the room.  4. Do not sleep on upholstered furniture (eg, couches).  5. If possible removing carpeting, upholstered furniture and drapery from the home is ideal. Horizontal blinds should be eliminated in the rooms where the person spends the most time (bedroom, study, television room). Washable vinyl, roller-type shades are optimal.  6. Remove all non-washable stuffed toys from the bedroom. Wash stuffed toys weekly like sheets and blankets above.  7. Reduce indoor humidity to less than 50%. Inexpensive humidity monitors can be purchased at most hardware stores. Do not use a humidifier as can make the problem worse and are not recommended.  Control of Dog or Cat Allergen Avoidance is the best way to manage a dog or cat allergy. If you have a dog or cat and are allergic to dog or cats, consider removing the dog or cat from the home. If you have a dog or cat but don't want to find it a new home, or if your family wants a pet even though someone in the household is allergic, here are some strategies that may help keep symptoms at bay:  Keep the pet out of your bedroom and restrict it to only  a few rooms. Be advised that keeping the dog or cat in only one room will not limit the allergens to that room. Don't pet, hug or kiss the dog or cat; if you do,  wash your hands with soap and water. High-efficiency particulate air (HEPA) cleaners run continuously in a bedroom or living room can reduce allergen levels over time. Regular use of a high-efficiency vacuum cleaner or a central vacuum can reduce allergen levels. Giving your dog or cat a bath at least once a week can reduce airborne allergen.  Control of Cockroach Allergen Cockroach allergen has been identified as an important cause of acute attacks of asthma, especially in urban settings.  There are fifty-five species of cockroach that exist in the [place], however only three, the [place], [place] species produce allergen that can affect patients with Asthma.  Allergens can be obtained from fecal particles, egg casings and secretions from cockroaches.    Remove food sources. Reduce access to water. Seal access and entry points. Spray runways with 0.5-1% Diazinon or Chlorpyrifos Blow boric acid power under stoves and refrigerator. Place bait stations (hydramethylnon) at feeding sites.  Return in about 6 weeks (around 07/12/2022), or if symptoms worsen or fail to improve.    Thank you for the opportunity to care for this patient.  Please do not hesitate to contact me with questions.  Althea Charon, FNP [facility]

## 2022-06-14 ENCOUNTER — Telehealth: Payer: Self-pay

## 2022-06-14 NOTE — Telephone Encounter (Signed)
I called patient to discuss her referral. Patient states she would prefer to see another provider at [facility]. I called their office and left a voicemail to discuss changing providers and getting her scheduled. Will follow back up on this tomorrow. Referral has been put in for their office.   Called and left a voicemail updating the patient .    [facility] [address] [place],  [place]  [address] Main: [phone]

## 2022-06-14 NOTE — Telephone Encounter (Signed)
-----   Message from Althea Charon, [place] sent at 05/31/2022  3:57 PM EST ----- Please refer Linda Cochran to see GI for poorly controlled reflux, history of hiatal hernia, and difficulty swallowing pills.She has seen Dr. Marius Ditch in the past

## 2022-06-26 ENCOUNTER — Telehealth: Payer: Self-pay

## 2022-06-26 NOTE — Telephone Encounter (Signed)
We received a referral for patient for poorly controlled reflux, history of hiatal hernia, and difficulty swallowing pills. Patient saw Dr. Marius Ditch in the past on 07/21/2020. Called patient to make appointment she asked if she could see another provider in the office. Informed patient I would have to get the doctors approval before scheduling because the doctors like you to stay with the doctor you started with. I asked the reason why she wants to transfer and she said that Dr. Marius Ditch said she had to be off all her stomach medications before she could have a test done. Looked and Dr. Marius Ditch had order the H pylori breath test and she needed to be off her omeprazole '40mg'$  before having the test done. She stated she can not take her stomach medication. Please advise if she can see Dr. Vicente Males or Dr. Allen Norris

## 2022-06-26 NOTE — Telephone Encounter (Signed)
Made appointment with dr. Allen Norris on 10/03/2022 at 3:15pm in [place]. Patient want to come to out [place] location

## 2022-10-03 ENCOUNTER — Ambulatory Visit: Payer: Medicaid Other | Admitting: Gastroenterology

## 2022-10-12 ENCOUNTER — Telehealth: Payer: Self-pay

## 2022-10-12 NOTE — Telephone Encounter (Signed)
Patient called in - DOB verified - stated she has COVID and is requesting something be called in for thrush.  Patient advised her last office visit was 05/31/22 - return in 6 weeks which she did not.  Patient advised message would be forwarded to provider for next step - she is available for a televisit.  Patient verbalized understanding, no further questions.

## 2022-10-12 NOTE — Telephone Encounter (Signed)
Called patient - DOB verified - advised of provider notation below.  Schedule tele visit with Thurston Hole @ 11:20 am in [facility].  Patient verbalized understanding - assisted with myChart reactivation, no further questions.  Forwarding message to [employer] as update.

## 2022-10-12 NOTE — Telephone Encounter (Signed)
Please have her schedule a tele visit. Thank you

## 2022-10-13 ENCOUNTER — Encounter: Payer: Self-pay | Admitting: Family Medicine

## 2022-10-13 ENCOUNTER — Ambulatory Visit (INDEPENDENT_AMBULATORY_CARE_PROVIDER_SITE_OTHER): Payer: Medicaid Other | Admitting: Family Medicine

## 2022-10-13 ENCOUNTER — Other Ambulatory Visit: Payer: Self-pay

## 2022-10-13 DIAGNOSIS — K219 Gastro-esophageal reflux disease without esophagitis: Secondary | ICD-10-CM

## 2022-10-13 DIAGNOSIS — B370 Candidal stomatitis: Secondary | ICD-10-CM

## 2022-10-13 DIAGNOSIS — J4540 Moderate persistent asthma, uncomplicated: Secondary | ICD-10-CM

## 2022-10-13 DIAGNOSIS — J302 Other seasonal allergic rhinitis: Secondary | ICD-10-CM

## 2022-10-13 DIAGNOSIS — H1013 Acute atopic conjunctivitis, bilateral: Secondary | ICD-10-CM

## 2022-10-13 DIAGNOSIS — R040 Epistaxis: Secondary | ICD-10-CM

## 2022-10-13 DIAGNOSIS — J310 Chronic rhinitis: Secondary | ICD-10-CM

## 2022-10-13 DIAGNOSIS — Z91038 Other insect allergy status: Secondary | ICD-10-CM

## 2022-10-13 DIAGNOSIS — H1010 Acute atopic conjunctivitis, unspecified eye: Secondary | ICD-10-CM

## 2022-10-13 DIAGNOSIS — J45909 Unspecified asthma, uncomplicated: Secondary | ICD-10-CM | POA: Diagnosis not present

## 2022-10-13 MED ORDER — OMEPRAZOLE 40 MG PO CPDR: 40.0000 mg | DELAYED_RELEASE_CAPSULE | Freq: Every day | ORAL | 2 refills | Status: DC

## 2022-10-13 MED ORDER — FLUCONAZOLE 150 MG PO TABS: ORAL_TABLET | ORAL | 0 refills | Status: DC

## 2022-10-13 MED ORDER — SYMBICORT 80-4.5 MCG/ACT IN AERO: 2.0000 | INHALATION_SPRAY | Freq: Two times a day (BID) | RESPIRATORY_TRACT | 2 refills | Status: DC

## 2022-10-13 MED ORDER — VENTOLIN HFA 108 (90 BASE) MCG/ACT IN AERS: 2.0000 | INHALATION_SPRAY | RESPIRATORY_TRACT | 1 refills | Status: DC | PRN

## 2022-10-13 MED ORDER — CARBINOXAMINE MALEATE 4 MG PO TABS: 4.0000 mg | ORAL_TABLET | Freq: Three times a day (TID) | ORAL | 2 refills | Status: DC | PRN

## 2022-10-13 MED ORDER — AZELASTINE HCL 0.1 % NA SOLN: NASAL | 5 refills | Status: DC

## 2022-10-13 NOTE — Patient Instructions (Addendum)
Asthma Continue Symbicort 80-2 puffs twice a day with a spacer to prevent cough or wheeze. Continue to rinse after each use.  Continue albuterol 2 puffs once every 4 hours as needed for cough or wheeze You may use albuterol 2 puffs 5 to 15 minutes before activity to decrease cough or wheeze You do qualify for a biologic medication for asthma management if necessary. We will submit your information to your insurance for Xolair. You will next hear from our biologic medication coordinator Tammy with next steps for Xolair.   Oral candidiasis Take diflucan 150 mg once. If your symptoms have not resolved by day 3 take an additional diflucan 150 mg once.  Begin using a spacer with your Symbicort. Continue to rinse your mouth after each Symbicort use. We will try to get a biologic therapy on board to decrease your symptoms of asthma which may allow for less inhaled corticosteroid use.   Allergic rhinitis Continue allergen avoidance measures directed toward grass pollen, weed pollen, tree pollen, mold, cat, dog, dust mite, and cockroach as listed below Continue carbinoxamine 4 mg tablets once every 6-8 hours as needed for a runny nose or itch Continue Nasacort 2 sprays in each nostril once a day as needed for stuffy nose Consider saline nasal rinses as needed for nasal symptoms. Use this before any medicated nasal sprays for best result Continue azelastine 2 sprays in each nostril up to twice a day as needed for a runny nose Consider nasal saline gel as needed for dry nostrils If your symptoms of allergic rhinitis are not well controlled with the treatment plan as listed above, consider allergen immunotherapy. Written information has been provided.   Allergic conjunctivitis Some over the counter eye drops include Pataday one drop in each eye once a day as needed for red, itchy eyes OR Zaditor one drop in each eye twice a day as needed for red itchy eyes.  Reflux Continue dietary and lifestyle  modifications as listed below Restart omeprazole 40 mg once a day as previously prescribed to prevent reflux  Epistaxis Pinch both nostrils while leaning forward for at least 5 minutes before checking to see if the bleeding has stopped. If bleeding is not controlled within 5-10 minutes apply a cotton ball soaked with oxymetazoline (Afrin) to the bleeding nostril for a few seconds.  If the problem persists or worsens a referral to ENT for further evaluation may be necessary.  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  In case of an allergic reaction, take Benadryl 50 mg every 4 hours, and if life-threatening symptoms occur, inject with EpiPen 0.3 mg. A lab order has been placed to help [place] assess your stinging insect allergy.  We will call you when the result becomes available  Call the clinic if this treatment plan is not working well for you.  Follow up in the clinic in the clinic in 2 months or sooner if needed.  Reducing Pollen Exposure The American Academy of Allergy, Asthma and Immunology suggests the following steps to reduce your exposure to pollen during allergy seasons. Do not hang sheets or clothing out to dry; pollen may collect on these items. Do not mow lawns or spend time around freshly cut grass; mowing stirs up pollen. Keep windows closed at night.  Keep car windows closed while driving. Minimize morning activities outdoors, a time when pollen counts are usually at their highest. Stay indoors as much as possible when pollen counts or humidity is high and on windy days  when pollen tends to remain in the air longer. Use air conditioning when possible.  Many air conditioners have filters that trap the pollen spores. Use a HEPA room air filter to remove pollen form the indoor air you breathe.  Control of Mold Allergen Mold and fungi can grow on a variety of surfaces provided certain temperature and moisture conditions exist.  Outdoor molds grow on plants,  decaying vegetation and soil.  The major outdoor mold, Alternaria and Cladosporium, are found in very high numbers during hot and dry conditions.  Generally, a late Summer - Fall peak is seen for common outdoor fungal spores.  Rain will temporarily lower outdoor mold spore count, but counts rise rapidly when the rainy period ends.  The most important indoor molds are Aspergillus and Penicillium.  Dark, humid and poorly ventilated basements are ideal sites for mold growth.  The next most common sites of mold growth are the bathroom and the kitchen.  Outdoor [employer] Use air conditioning and keep windows closed Avoid exposure to decaying vegetation. Avoid leaf raking. Avoid grain handling. Consider wearing a face mask if working in moldy areas.  Indoor Mold Control Maintain humidity below 50%. Clean washable surfaces with 5% bleach solution. Remove sources e.g. Contaminated carpets.   Control of Dust Mite Allergen Dust mites play a major role in allergic asthma and rhinitis. They occur in environments with high humidity wherever human skin is found. Dust mites absorb humidity from the atmosphere (ie, they do not drink) and feed on organic matter (including shed human and animal skin). Dust mites are a microscopic type of insect that you cannot see with the naked eye. High levels of dust mites have been detected from mattresses, pillows, carpets, upholstered furniture, bed covers, clothes, soft toys and any woven material. The principal allergen of the dust mite is found in its feces. A gram of dust may contain 1,000 mites and 250,000 fecal particles. Mite antigen is easily measured in the air during house cleaning activities. Dust mites do not bite and do not cause harm to humans, other than by triggering allergies/asthma.  Ways to decrease your exposure to dust mites in your home:  1. Encase mattresses, box springs and pillows with a mite-impermeable barrier or cover  2. Wash sheets,  blankets and drapes weekly in hot water (130 F) with detergent and dry them in a dryer on the hot setting.  3. Have the room cleaned frequently with a vacuum cleaner and a damp dust-mop. For carpeting or rugs, vacuuming with a vacuum cleaner equipped with a high-efficiency particulate air (HEPA) filter. The dust mite allergic individual should not be in a room which is being cleaned and should wait 1 hour after cleaning before going into the room.  4. Do not sleep on upholstered furniture (eg, couches).  5. If possible removing carpeting, upholstered furniture and drapery from the home is ideal. Horizontal blinds should be eliminated in the rooms where the person spends the most time (bedroom, study, television room). Washable vinyl, roller-type shades are optimal.  6. Remove all non-washable stuffed toys from the bedroom. Wash stuffed toys weekly like sheets and blankets above.  7. Reduce indoor humidity to less than 50%. Inexpensive humidity monitors can be purchased at most hardware stores. Do not use a humidifier as can make the problem worse and are not recommended.  Control of Dog or Cat Allergen Avoidance is the best way to manage a dog or cat allergy. If you have a dog or cat  and are allergic to dog or cats, consider removing the dog or cat from the home. If you have a dog or cat but don't want to find it a new home, or if your family wants a pet even though someone in the household is allergic, here are some strategies that may help keep symptoms at bay:  Keep the pet out of your bedroom and restrict it to only a few rooms. Be advised that keeping the dog or cat in only one room will not limit the allergens to that room. Don't pet, hug or kiss the dog or cat; if you do, wash your hands with soap and water. High-efficiency particulate air (HEPA) cleaners run continuously in a bedroom or living room can reduce allergen levels over time. Regular use of a high-efficiency vacuum cleaner or a  central vacuum can reduce allergen levels. Giving your dog or cat a bath at least once a week can reduce airborne allergen.  Control of Cockroach Allergen Cockroach allergen has been identified as an important cause of acute attacks of asthma, especially in urban settings.  There are fifty-five species of cockroach that exist in the [place], however only three, the [place], [place] species produce allergen that can affect patients with Asthma.  Allergens can be obtained from fecal particles, egg casings and secretions from cockroaches.    Remove food sources. Reduce access to water. Seal access and entry points. Spray runways with 0.5-1% Diazinon or Chlorpyrifos Blow boric acid power under stoves and refrigerator. Place bait stations (hydramethylnon) at feeding sites.

## 2022-10-13 NOTE — Progress Notes (Unsigned)
RE: Linda Cochran MRN: [id number] DOB: May 14, 1976 Date of Telemedicine Visit: 10/13/2022  Referring provider: Ladora Daniel, PA-C Primary care provider: Ladora Daniel, PA-C  Chief Complaint: Thrush, Shortness of Breath, and Covid Positive   Telemedicine Follow Up Visit via Telephone: I connected with Linda Cochran for a follow up on 10/15/22 by telephone and verified that I am speaking with the correct person using two identifiers.   I discussed the limitations, risks, security and privacy concerns of performing an evaluation and management service by telephone and the availability of in person appointments. I also discussed with the patient that there may be a patient responsible charge related to this service. The patient expressed understanding and agreed to proceed.  Patient is at home   Provider is at the office.  Visit start time: 1131 Visit end time: 1150 Insurance consent/check in by: [facility] consent and medical assistant/nurse: Ashleigh  History of Present Illness: She is a 47 y.o. female, who is being followed for asthma, allergic rhinitis, allergic conjunctivitis, reflux, and epistaxis. Her previous allergy office visit was on 05/31/2022 with  Nehemiah Settle, FNP .  She reports that over the last several days, she has developed a white coating on her tongue and in the back of her throat.  She has received treatment for oral candidiasis in January, March, and April.  She reports that her asthma has been moderately well-controlled with shortness of breath as the main symptom. She denies cough or wheeze with activity or rest.  She continues Symbicort 80-2 puffs twice a day with a spacer and rarely uses albuterol for relief of symptoms.  She does report that she had bronchitis in January and has recently been diagnosed with COVID.  She is currently quarantining with girlfriends.  Her previous IgE level from 05/03/2021 was 284 and indoor environmental allergies include cat, dog, and dust  mite.  CBC from 05/03/2021 indicates absolute eosinophil 300.  Allergic rhinitis is reported as moderately well-controlled with symptoms including occasional clear rhinorrhea and\] nasal congestion.  She continues montelukast 10 mg once a day and uses azelastine as needed.  She is not currently using a nasal saline rinse.  She reports a few isolated incidences of epistaxis which she attributes to dry nostrils.  She occasionally uses saline nasal rinses, however, has not been using nasal saline gel.  Allergic conjunctivitis is reported as well-controlled with no symptoms currently.    Reflux is reported as well-controlled with no symptoms including heartburn or vomiting.  She continues omeprazole daily and continues to follow-up with her GI specialist.  She has not had an encounter with a stinging insects since her last visit to this clinic.  EpiPen's are up-to-date.  Her current medications are listed in the chart.  Assessment and Plan: Linda Cochran is a 47 y.o. female Patient Instructions  Asthma Continue Symbicort 80-2 puffs twice a day with a spacer to prevent cough or wheeze. Continue to rinse after each use.  Continue albuterol 2 puffs once every 4 hours as needed for cough or wheeze You may use albuterol 2 puffs 5 to 15 minutes before activity to decrease cough or wheeze You do qualify for a biologic medication for asthma management if necessary. We will submit your information to your insurance for Xolair. You will next hear from our biologic medication coordinator Linda Cochran with next steps for Xolair.   Oral candidiasis Take diflucan 150 mg once. If your symptoms have not resolved by day 3 take an additional diflucan 150 mg once.  Begin using a spacer with your Symbicort. Continue to rinse your mouth after each Symbicort use. We will try to get a biologic therapy on board to decrease your symptoms of asthma which may allow for less inhaled corticosteroid use.   Allergic rhinitis Continue  allergen avoidance measures directed toward grass pollen, weed pollen, tree pollen, mold, cat, dog, dust mite, and cockroach as listed below Continue carbinoxamine 4 mg tablets once every 6-8 hours as needed for a runny nose or itch Continue Nasacort 2 sprays in each nostril once a day as needed for stuffy nose Consider saline nasal rinses as needed for nasal symptoms. Use this before any medicated nasal sprays for best result Continue azelastine 2 sprays in each nostril up to twice a day as needed for a runny nose Consider nasal saline gel as needed for dry nostrils If your symptoms of allergic rhinitis are not well controlled with the treatment plan as listed above, consider allergen immunotherapy. Written information has been provided.   Allergic conjunctivitis Some over the counter eye drops include Pataday one drop in each eye once a day as needed for red, itchy eyes OR Zaditor one drop in each eye twice a day as needed for red itchy eyes.  Reflux Continue dietary and lifestyle modifications as listed below Restart omeprazole 40 mg once a day as previously prescribed to prevent reflux  Epistaxis Pinch both nostrils while leaning forward for at least 5 minutes before checking to see if the bleeding has stopped. If bleeding is not controlled within 5-10 minutes apply a cotton ball soaked with oxymetazoline (Afrin) to the bleeding nostril for a few seconds.  If the problem persists or worsens a referral to ENT for further evaluation may be necessary.  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  In case of an allergic reaction, take Benadryl 50 mg every 4 hours, and if life-threatening symptoms occur, inject with EpiPen 0.3 mg. A lab order has been placed to help [place] assess your stinging insect allergy.  We will call you when the result becomes available  Call the clinic if this treatment plan is not working well for you.  Follow up in the clinic in the clinic in 2 months or  sooner if needed.   Return in about 2 months (around 12/13/2022), or if symptoms worsen or fail to improve.  Meds ordered this encounter  Medications   fluconazole (DIFLUCAN) 150 MG tablet    Sig: Take one tablet now. If symptoms persist, take a second tablet on day 3    Dispense:  2 tablet    Refill:  0   SYMBICORT 80-4.5 MCG/ACT inhaler    Sig: Inhale 2 puffs into the lungs in the morning and at bedtime.    Dispense:  10.2 g    Refill:  2   azelastine (ASTELIN) 0.1 % nasal spray    Sig: Use 1 to 2 sprays in each nostril 1-2 times a day as needed for runny nose/drainage down throat    Dispense:  30 mL    Refill:  5   Carbinoxamine Maleate 4 MG TABS    Sig: Take 1 tablet (4 mg total) by mouth every 8 (eight) hours as needed.    Dispense:  90 tablet    Refill:  2   omeprazole (PRILOSEC) 40 MG capsule    Sig: Take 1 capsule (40 mg total) by mouth daily.    Dispense:  30 capsule    Refill:  2  VENTOLIN HFA 108 (90 Base) MCG/ACT inhaler    Sig: Inhale 2 puffs into the lungs every 4 (four) hours as needed for wheezing or shortness of breath.    Dispense:  18 g    Refill:  1     Medication List:  Current Outpatient Medications  Medication Sig Dispense Refill   AIMOVIG 140 MG/ML SOAJ SMARTSIG:140 Milligram(s) SUB-Q Once a Month     albuterol (VENTOLIN HFA) 108 (90 Base) MCG/ACT inhaler Inhale 2 puffs every 4-6 hours as needed for cough, wheeze, tightness in chest, or shortness of breath. 18 g 1   amitriptyline (ELAVIL) 50 MG tablet TAKE 1 TABLET BY MOUTH EVERYDAY AT BEDTIME (Patient taking differently: Take 25 mg by mouth at bedtime. TAKE 1 TABLET BY MOUTH EVERYDAY AT BEDTIME) 90 tablet 0   cyclobenzaprine (FLEXERIL) 10 MG tablet Take 10 mg by mouth in the morning and at bedtime.  1   DULoxetine (CYMBALTA) 30 MG capsule Take by mouth.     eletriptan (RELPAX) 40 MG tablet TAKE 1 TABLET BY MOUTH AS NEEDED. MAY REPEAT IN 2 HOURS IF NECESSARY. NO MORE THAN 2 IN 24 HOURS.     EPIPEN  2-PAK 0.3 MG/0.3ML SOAJ injection Inject 0.3 mg into the muscle as directed. 1 each 1   estradiol (ESTRACE) 1 MG tablet Take 1 tablet (1 mg total) by mouth daily. 90 tablet 3   fluconazole (DIFLUCAN) 150 MG tablet Take one tablet now. If symptoms persist, take a second tablet on day 3 2 tablet 0   magnesium oxide (MAG-OX) 400 (240 Mg) MG tablet TAKE ONE CAPSULE (400 MG DOSE) BY MOUTH DAILY.     meclizine (ANTIVERT) 25 MG tablet Take 25 mg by mouth 3 (three) times daily as needed for dizziness.     meloxicam (MOBIC) 7.5 MG tablet Take by mouth.     montelukast (SINGULAIR) 10 MG tablet Take 1 tablet (10 mg total) by mouth at bedtime. 30 tablet 5   ondansetron (ZOFRAN) 4 MG/5ML solution      QUEtiapine (SEROQUEL) 50 MG tablet Take by mouth.     scopolamine (TRANSDERM-SCOP) 1 MG/3DAYS Place onto the skin.     Spacer/Aero-Holding Chambers DEVI 1 Device by Does not apply route as directed. 1 each 1   traMADol (ULTRAM) 50 MG tablet Take by mouth.     VENTOLIN HFA 108 (90 Base) MCG/ACT inhaler Inhale 2 puffs into the lungs every 4 (four) hours as needed for wheezing or shortness of breath. 18 g 1   azelastine (ASTELIN) 0.1 % nasal spray Use 1 to 2 sprays in each nostril 1-2 times a day as needed for runny nose/drainage down throat 30 mL 5   Carbinoxamine Maleate 4 MG TABS Take 1 tablet (4 mg total) by mouth every 8 (eight) hours as needed. 90 tablet 2   gabapentin (NEURONTIN) 300 MG capsule Take 600 mg by mouth 4 (four) times daily.     omeprazole (PRILOSEC) 40 MG capsule Take 1 capsule (40 mg total) by mouth daily. 30 capsule 2   SYMBICORT 80-4.5 MCG/ACT inhaler Inhale 2 puffs into the lungs in the morning and at bedtime. 10.2 g 2   No current facility-administered medications for this visit.   Allergies: Allergies  Allergen Reactions   Methylprednisolone Other (See Comments)    Paralytic reaction   Codeine Hives and Itching   Dilaudid [Hydromorphone Hcl] Hives and Itching   Morphine And Related  Hives and Itching   Pantoprazole Sodium Diarrhea and Nausea  Only    Stomach cramps   Penicillins Hives   Tramadol Diarrhea    Diarrhea (GI UPSET)   I reviewed her past medical history, social history, family history, and environmental history and no significant changes have been reported from previous visit on 05/31/2022.  Objective: Physical Exam Not obtained as encounter was done via telephone.   Previous notes and tests were reviewed.  I discussed the assessment and treatment plan with the patient. The patient was provided an opportunity to ask questions and all were answered. The patient agreed with the plan and demonstrated an understanding of the instructions.   The patient was advised to call back or seek an in-person evaluation if the symptoms worsen or if the condition fails to improve as anticipated.  I provided 19 minutes of non-face-to-face time during this encounter.  It was my pleasure to participate in [facility] care today. Please feel free to contact me with any questions or concerns.   Sincerely,  Thermon Leyland, FNP

## 2022-10-15 ENCOUNTER — Encounter: Payer: Self-pay | Admitting: Family Medicine

## 2022-10-15 DIAGNOSIS — B370 Candidal stomatitis: Secondary | ICD-10-CM | POA: Insufficient documentation

## 2022-10-16 ENCOUNTER — Other Ambulatory Visit: Payer: Self-pay | Admitting: Allergy & Immunology

## 2022-10-16 ENCOUNTER — Telehealth: Payer: Self-pay

## 2022-10-16 ENCOUNTER — Other Ambulatory Visit (HOSPITAL_COMMUNITY): Payer: Self-pay

## 2022-10-16 NOTE — Telephone Encounter (Signed)
Patient Advocate Encounter   Received notification from [facility] [place] that prior authorization is required for Carbinoxamine Maleate 4MG  tablets   Submitted: 10-16-2022 Key [id number]  Status is pending

## 2022-10-16 NOTE — Telephone Encounter (Addendum)
Called [facility]- spoke with Allison,Pharmacist- DOB verified - advised of below approval for Carbinoxamine Maleate 4 mg  - reran prescription/ approved.  Called patient - DOB verified - advised of the above notation.    Patient advised pharmacy will contact once prescription has been filled and ready for pick up.  Patient verbalized understanding, no further questions.

## 2022-10-16 NOTE — Telephone Encounter (Signed)
Patient Advocate Encounter  Prior Authorization for Carbinoxamine Maleate 4MG  tablets has been approved through [employer]    KeyHardie Lora  Effective: 10-16-2022 to 10-15-2023

## 2022-10-17 ENCOUNTER — Other Ambulatory Visit: Payer: Self-pay

## 2022-10-17 DIAGNOSIS — J4540 Moderate persistent asthma, uncomplicated: Secondary | ICD-10-CM

## 2022-10-17 NOTE — Progress Notes (Signed)
Called patient and made her aware of the labs needed to qualify her for the biologic shot. Patient stated that she will come into the office on Thursday to get her blood drawn.

## 2022-10-26 ENCOUNTER — Ambulatory Visit: Payer: Medicaid Other | Admitting: Gastroenterology

## 2022-12-13 ENCOUNTER — Telehealth: Payer: Self-pay | Admitting: Family Medicine

## 2022-12-13 NOTE — Telephone Encounter (Signed)
Patient called stating she has been experiencing thrush since February with her daily inhaler that she takes morning and night. Patient wants to see about getting asthma injections to stop taking daily inhaler.

## 2022-12-13 NOTE — Telephone Encounter (Signed)
Excellent - thank you, Cree!   Malachi Bonds, MD [facility]

## 2022-12-20 ENCOUNTER — Ambulatory Visit: Payer: Medicaid Other | Admitting: Allergy & Immunology

## 2022-12-20 ENCOUNTER — Encounter: Payer: Self-pay | Admitting: Allergy & Immunology

## 2022-12-20 VITALS — BP 132/84 | HR 100 | Temp 98.3°F | Resp 16 | Ht 64.76 in | Wt 198.6 lb

## 2022-12-20 DIAGNOSIS — B3781 Candidal esophagitis: Secondary | ICD-10-CM

## 2022-12-20 DIAGNOSIS — K219 Gastro-esophageal reflux disease without esophagitis: Secondary | ICD-10-CM

## 2022-12-20 DIAGNOSIS — B999 Unspecified infectious disease: Secondary | ICD-10-CM

## 2022-12-20 DIAGNOSIS — J4540 Moderate persistent asthma, uncomplicated: Secondary | ICD-10-CM | POA: Diagnosis not present

## 2022-12-20 DIAGNOSIS — H04123 Dry eye syndrome of bilateral lacrimal glands: Secondary | ICD-10-CM

## 2022-12-20 DIAGNOSIS — R899 Unspecified abnormal finding in specimens from other organs, systems and tissues: Secondary | ICD-10-CM

## 2022-12-20 DIAGNOSIS — R682 Dry mouth, unspecified: Secondary | ICD-10-CM

## 2022-12-20 DIAGNOSIS — B370 Candidal stomatitis: Secondary | ICD-10-CM

## 2022-12-20 DIAGNOSIS — Z91038 Other insect allergy status: Secondary | ICD-10-CM

## 2022-12-20 MED ORDER — FLUCONAZOLE 150 MG PO TABS: 150.0000 mg | ORAL_TABLET | Freq: Every day | ORAL | 0 refills | Status: DC

## 2022-12-20 MED ORDER — FLUCONAZOLE 150 MG PO TABS: 150.0000 mg | ORAL_TABLET | Freq: Every day | ORAL | 0 refills | Status: AC

## 2022-12-20 MED ORDER — SPIRIVA RESPIMAT 1.25 MCG/ACT IN AERS: 2.0000 | INHALATION_SPRAY | Freq: Every morning | RESPIRATORY_TRACT | 1 refills | Status: DC

## 2022-12-20 MED ORDER — SPACER/AERO-HOLDING CHAMBERS DEVI: 1.0000 | 1 refills | Status: AC

## 2022-12-20 NOTE — Patient Instructions (Addendum)
Asthma - Lung testing not done today.  - Get the labs to see if we can start an injectable asthma medication (which might allow [place] to avoid using inhaled steroids). - Stop the Symbicort and start Spiriva two puffs once daily.  - Continue albuterol 2 puffs once every 4 hours as needed for cough or wheeze  Oral candidiasis - Start Diflucan 150 mg once daily for TWO WEEKS. - We are getting an immune workup since you have had thrush so many times.  - We will put in a referral to see GI as well.  - Continue with the GERD medicine twice daily.   Allergic rhinitis (grasses, ragweed, weeds, trees, indoor molds, outdoor molds, dust mites, cat, dog, and cockroach) - It seems that you have a good handle on these symptoms. - We are going to continue with carbinoxamine 4 mg tablets once every 6-8 hours as needed for a runny nose or itch - Continue Nasacort 2 sprays in each nostril once a day as needed for stuffy nose - Consider saline nasal rinses as needed for nasal symptoms. Use this before any medicated nasal sprays for best result - Continue azelastine 2 sprays in each nostril up to twice a day as needed for a runny nose  Reflux - Continue dietary and lifestyle modifications as listed below - Continue with omeprazole 40 mg twice daily.   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  In case of an allergic reaction, take Benadryl 50 mg every 4 hours, and if life-threatening symptoms occur, inject with EpiPen 0.3 mg. - A lab order has been placed to help [place] assess your stinging insect allergy.    Return in about 4 weeks (around 01/17/2023). You can have the follow up appointment with Dr. Dellis Anes or a Nurse Practicioner (our Nurse Practitioners are excellent and always have Physician oversight!).    Please inform us of any Emergency Department visits, hospitalizations, or changes in symptoms. Call us before going to the ED for breathing or allergy symptoms since we might be able to  fit you in for a sick visit. Feel free to contact us anytime with any questions, problems, or concerns.  It was a pleasure to see you again today!  Websites that have reliable patient information: 1. American Academy of Asthma, Allergy, and Immunology: www.aaaai.org 2. Food Allergy Research and Education (FARE): foodallergy.org 3. Mothers of Asthmatics: http://www.asthmacommunitynetwork.org 4. American College of Allergy, Asthma, and Immunology: www.acaai.org   COVID-19 Vaccine Information can be found at: [email or web address] For questions related to vaccine distribution or appointments, please email [email or web address] or call [phone].   We realize that you might be concerned about having an allergic reaction to the COVID19 vaccines. To help with that concern, WE ARE OFFERING THE COVID19 VACCINES IN OUR OFFICE! Ask the front desk for dates!     "Like" [place] on Facebook and Instagram for our latest updates!      A healthy democracy works best when [employer] participate! Make sure you are registered to vote! If you have moved or changed any of your contact information, you will need to get this updated before voting!  In some cases, you MAY be able to register to vote online: [email or web address]

## 2022-12-20 NOTE — Progress Notes (Signed)
FOLLOW UP  Date of Service/Encounter:  12/20/22   Assessment:   Moderate persistent asthma, uncomplicated   Seasonal and perennial allergic rhinitis (grasses, ragweed, weeds, trees, indoor molds, outdoor molds, dust mites, cat, dog, and cockroach)  Recurrent oropharyngeal Candidal infections - getting immune workup today  GERD - sending to Gastroenterology  Plan/Recommendations:   Asthma - Lung testing not done today.  - Get the labs to see if we can start an injectable asthma medication (which might allow [place] to avoid using inhaled steroids). - Stop the Symbicort and start Spiriva two puffs once daily.  - Continue albuterol 2 puffs once every 4 hours as needed for cough or wheeze  Oral candidiasis - Start Diflucan 150 mg once daily for TWO WEEKS. - We are getting an immune workup since you have had thrush so many times.  - We will put in a referral to see GI as well.  - Continue with the GERD medicine twice daily.   Allergic rhinitis (grasses, ragweed, weeds, trees, indoor molds, outdoor molds, dust mites, cat, dog, and cockroach) - It seems that you have a good handle on these symptoms. - We are going to continue with carbinoxamine 4 mg tablets once every 6-8 hours as needed for a runny nose or itch - Continue Nasacort 2 sprays in each nostril once a day as needed for stuffy nose - Consider saline nasal rinses as needed for nasal symptoms. Use this before any medicated nasal sprays for best result - Continue azelastine 2 sprays in each nostril up to twice a day as needed for a runny nose  Reflux - Continue dietary and lifestyle modifications as listed below - Continue with omeprazole 40 mg twice daily.   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  In case of an allergic reaction, take Benadryl 50 mg every 4 hours, and if life-threatening symptoms occur, inject with EpiPen 0.3 mg. - A lab order has been placed to help [place] assess your stinging insect  allergy.    Return in about 4 weeks (around 01/17/2023). You can have the follow up appointment with Dr. Dellis Anes or a Nurse Practicioner (our Nurse Practitioners are excellent and always have Physician oversight!).   Subjective:   Linda Cochran is a 47 y.o. female presenting today for follow up of  Chief Complaint  Patient presents with   Immunotherapy    Wants to start on biologics.    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   Acute maxillary sinusitis 08/08/2021   Not well controlled moderate persistent asthma 08/08/2021   Seasonal and perennial allergic rhinitis 08/08/2021   Gastroesophageal reflux disease 08/08/2021   Left carpal tunnel syndrome 05/29/2020   TFCC (triangular fibrocartilage complex) tear, left, initial encounter 05/29/2020   Cough variant asthma vs UACS 05/26/2020   Family history of breast cancer 12/17/2019   Vasomotor symptoms due to menopause 12/17/2019   Rheumatoid arthritis of multiple sites with negative rheumatoid factor (HCC) 09/30/2019   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  Linda Cochran is a 47 y.o. female presenting for a follow up visit.  She was last seen via televisit in May 2024 by Thurston Hole  Ambs, one of our Ambulance person.  At that time, she was continued on Symbicort 80 mcg 2 puffs twice daily with a spacer.  We did talk about starting a biologic medication for her asthma.  For her candidiasis, she was started on Diflucan 150 mg once.  For her allergic rhinitis, we continued with carbinoxamine as well as Nasacort and Astelin.  Her reflux was not under good control.  They restarted omeprazole 40 mg daily.  She continues to  avoid stinging insects.  She never did get blood work done to look at the hymenoptera panel.  Likewise, she did not get an IgE or a complete blood panel done in May.  Since the last visit, she has not done well.   Asthma/Respiratory Symptom History: She reports that every time that she uses her inhaler, she contracts oral thrush. She has been treated with the mouth wash for ten days. She was fine for two weeks and then she got it again. She also was on Diflucan in May. This seemed to work and then it came back again. She has never cleared up from the back of her tongue. She had been doing well on the Symbicort for one year before the thrush started happening.  She does get vaginal yeast infections relatively frequently as well. But she does not get viral infections too often.   She did get COVID bronchitis with walking pneumonia in early 2024. She was treated with azithromycin which did not work at all. Then she went to She has had this four times now. She has caught it from her mother and her kids. She finally got prednisone after 3-4 rounds of antibiotics and finally improved. We have never done any kid of immune workup.  She has a dry mouth constantly. She reports that she has "no saliva" and she is constantly drinking and constantly thirsty. She has a working diagnosis of RA and fibromyalgia. She is followed by Dr. Caesar Bookman at [facility]. She is on Cymbalta and Mobic. She also has tramadol to use BID.   Allergic Rhinitis Symptom History: Allergic rhinitis is under good control with the ue of Nasacort and Astelin. This seems to be working well to control her symptoms.   GERD Symptom History: GERD is under good control with the use of omeprazole. She has been doing this regularly and in fact the dose was recently increased to BID.   Otherwise, there have been no changes to her past medical history, surgical history, family history, or social history.    Review of Systems  Constitutional:  Negative.  Negative for chills, fever, malaise/fatigue and weight loss.  HENT:  Positive for congestion. Negative for ear discharge, ear pain and sinus pain.        Positive for globus sensation.   Eyes:  Negative for pain, discharge and redness.  Respiratory:  Negative for cough, sputum production, shortness of breath and wheezing.   Cardiovascular: Negative.  Negative for chest pain and palpitations.  Gastrointestinal:  Negative for abdominal pain, diarrhea, heartburn, nausea and vomiting.  Skin: Negative.  Negative for itching and rash.  Neurological:  Negative for dizziness and headaches.  Endo/Heme/Allergies:  Positive for environmental allergies. Does not bruise/bleed easily.       Objective:   Blood pressure 132/84, pulse 100, temperature 98.3 F (36.8 C), temperature source Temporal, resp. rate 16, height 5' 4.76" (1.645 m), weight 198 lb 9.6 oz (90.1 kg), SpO2 95 %. Body mass index is 33.29 kg/m.  Physical Exam Vitals reviewed.  Constitutional:      Appearance: She is well-developed.  HENT:     Head: Normocephalic and atraumatic.     Right Ear: Tympanic membrane, ear canal and external ear normal. No drainage, swelling or tenderness. Tympanic membrane is not injected, scarred, erythematous, retracted or bulging.     Left Ear: Tympanic membrane, ear canal and external ear normal. No drainage, swelling or tenderness. Tympanic membrane is not injected, scarred, erythematous, retracted or bulging.     Nose: No nasal deformity, septal deviation, mucosal edema or rhinorrhea.     Right Turbinates: Enlarged, swollen and pale.     Left Turbinates: Enlarged, swollen and pale.     Right Sinus: No maxillary sinus tenderness or frontal sinus tenderness.     Left Sinus: No maxillary sinus tenderness or frontal sinus tenderness.     Mouth/Throat:     Mouth: Mucous membranes are not pale and not dry.     Pharynx: Uvula midline.     Comments: Oral plaques on her tongue consistent  with candidiasis. Eyes:     General:        Right eye: No discharge.        Left eye: No discharge.     Conjunctiva/sclera: Conjunctivae normal.     Right eye: Right conjunctiva is not injected. No chemosis.    Left eye: Left conjunctiva is not injected. No chemosis.    Pupils: Pupils are equal, round, and reactive to light.  Cardiovascular:     Rate and Rhythm: Normal rate and regular rhythm.     Heart sounds: Normal heart sounds.  Pulmonary:     Effort: Pulmonary effort is normal. No tachypnea, accessory muscle usage or respiratory distress.     Breath sounds: Normal breath sounds. No wheezing, rhonchi or rales.     Comments: Moving air well in all lung fields. No increased work of breathing noted.  Chest:     Chest wall: No tenderness.  Lymphadenopathy:     Head:     Right side of head: No submandibular, tonsillar or occipital adenopathy.     Left side of head: No submandibular, tonsillar or occipital adenopathy.     Cervical: No cervical adenopathy.  Skin:    General: Skin is warm.     Capillary Refill: Capillary refill takes less than 2 seconds.     Coloration: Skin is not pale.     Findings: No abrasion, erythema, petechiae or rash. Rash is not papular, urticarial or vesicular.  Neurological:     Mental Status: She is alert.  Psychiatric:        Behavior: Behavior is cooperative.      Diagnostic studies: none      Malachi Bonds, MD  [facility]

## 2022-12-22 LAB — C-REACTIVE PROTEIN: CRP: 7 mg/L (ref 0–10)

## 2022-12-22 LAB — SEDIMENTATION RATE: Sed Rate: 17 mm/hr (ref 0–32)

## 2022-12-22 LAB — SJOGRENS SYNDROME-A EXTRACTABLE NUCLEAR ANTIBODY: ENA SSA (RO) Ab: <0.2 AI (ref 0.0–0.9)

## 2022-12-23 LAB — ALLERGEN HYMENOPTERA PANEL

## 2022-12-23 LAB — IGE: IgE (Immunoglobulin E), Serum: 238 IU/mL (ref 6–495)

## 2022-12-24 LAB — ALLERGEN HYMENOPTERA PANEL
Hornet, White Face, IgE: 0.25 kU/L — AB
Hornet, Yellow, IgE: <0.1 kU/L
Yellow Jacket, IgE: 0.15 kU/L — AB

## 2022-12-24 LAB — TRYPTASE: Tryptase: 13.7 ug/L — ABNORMAL HIGH (ref 2.2–13.2)

## 2022-12-27 LAB — T-HELPER CELLS CD4/CD8 %
% CD 4 Pos. Lymph.: 39.4 % (ref 30.8–58.5)
Absolute CD 4 Helper: 827 /uL (ref 359–1519)
Basophils Absolute: 0.1 10*3/uL (ref 0.0–0.2)
Basos: 1 %
CD3+CD4+ Cells/CD3+CD8+ Cells Bld: 1.57 (ref 0.92–3.72)
CD3+CD8+ Cells # Bld: 527 /uL (ref 109–897)
CD3+CD8+ Cells NFr Bld: 25.1 % (ref 12.0–35.5)
EOS (ABSOLUTE): 0.4 10*3/uL (ref 0.0–0.4)
Eos: 5 %
Hematocrit: 41.3 % (ref 34.0–46.6)
Hemoglobin: 13.4 g/dL (ref 11.1–15.9)
Immature Grans (Abs): 0 10*3/uL (ref 0.0–0.1)
Immature Granulocytes: 0 %
Lymphocytes Absolute: 2.1 10*3/uL (ref 0.7–3.1)
Lymphs: 23 %
MCH: 27.1 pg (ref 26.6–33.0)
MCHC: 32.4 g/dL (ref 31.5–35.7)
MCV: 83 fL (ref 79–97)
Monocytes Absolute: 0.4 10*3/uL (ref 0.1–0.9)
Monocytes: 5 %
Neutrophils Absolute: 5.9 10*3/uL (ref 1.4–7.0)
Neutrophils: 66 %
Platelets: 240 10*3/uL (ref 150–450)
RBC: 4.95 x10E6/uL (ref 3.77–5.28)
RDW: 12.7 % (ref 11.7–15.4)
WBC: 8.9 10*3/uL (ref 3.4–10.8)

## 2022-12-27 LAB — IGG, IGA, IGM
IgA/Immunoglobulin A, Serum: 289 mg/dL (ref 87–352)
IgG (Immunoglobin G), Serum: 1053 mg/dL (ref 586–1602)
IgM (Immunoglobulin M), Srm: 64 mg/dL (ref 26–217)

## 2022-12-27 LAB — DIPHTHERIA / TETANUS ANTIBODY PANEL
Diphtheria Ab: 0.19 IU/mL (ref <0.10)
Tetanus Ab, IgG: 0.93 IU/mL (ref <0.10)

## 2022-12-27 LAB — STREP PNEUMONIAE 23 SEROTYPES IGG
Pneumo Ab Type 1*: 0.2 ug/mL — ABNORMAL LOW (ref >1.3)
Pneumo Ab Type 12 (12F)*: <0.1 ug/mL — ABNORMAL LOW (ref >1.3)
Pneumo Ab Type 14*: 2.5 ug/mL (ref >1.3)
Pneumo Ab Type 17 (17F)*: <0.1 ug/mL — ABNORMAL LOW (ref >1.3)
Pneumo Ab Type 19 (19F)*: 1.3 ug/mL — ABNORMAL LOW (ref >1.3)
Pneumo Ab Type 2*: <0.2 ug/mL — ABNORMAL LOW (ref >1.3)
Pneumo Ab Type 20*: <0.2 ug/mL — ABNORMAL LOW (ref >1.3)
Pneumo Ab Type 22 (22F)*: <0.1 ug/mL — ABNORMAL LOW (ref >1.3)
Pneumo Ab Type 23 (23F)*: <0.1 ug/mL — ABNORMAL LOW (ref >1.3)
Pneumo Ab Type 26 (6B)*: <0.1 ug/mL — ABNORMAL LOW (ref >1.3)
Pneumo Ab Type 3*: <0.1 ug/mL — ABNORMAL LOW (ref >1.3)
Pneumo Ab Type 34 (10A)*: <0.1 ug/mL — ABNORMAL LOW (ref >1.3)
Pneumo Ab Type 4*: <0.1 ug/mL — ABNORMAL LOW (ref >1.3)
Pneumo Ab Type 43 (11A)*: 0.2 ug/mL — ABNORMAL LOW (ref >1.3)
Pneumo Ab Type 5*: 0.5 ug/mL — ABNORMAL LOW (ref >1.3)
Pneumo Ab Type 51 (7F)*: 0.8 ug/mL — ABNORMAL LOW (ref >1.3)
Pneumo Ab Type 54 (15B)*: <0.2 ug/mL — ABNORMAL LOW (ref >1.3)
Pneumo Ab Type 56 (18C)*: <0.1 ug/mL — ABNORMAL LOW (ref >1.3)
Pneumo Ab Type 57 (19A)*: 2.4 ug/mL (ref >1.3)
Pneumo Ab Type 68 (9V)*: 2 ug/mL (ref >1.3)
Pneumo Ab Type 70 (33F)*: 2.7 ug/mL (ref >1.3)
Pneumo Ab Type 8*: 1.3 ug/mL — ABNORMAL LOW (ref >1.3)
Pneumo Ab Type 9 (9N)*: 1 ug/mL — ABNORMAL LOW (ref >1.3)

## 2022-12-27 LAB — COMPLEMENT, TOTAL: Compl, Total (CH50): >60 U/mL (ref >41)

## 2022-12-27 NOTE — Addendum Note (Signed)
Addended by: Alfonse Spruce on: 12/27/2022 10:09 AM   Modules accepted: Orders

## 2023-01-10 ENCOUNTER — Other Ambulatory Visit: Payer: Self-pay | Admitting: Family Medicine

## 2023-01-17 ENCOUNTER — Ambulatory Visit: Payer: Medicaid Other | Admitting: Internal Medicine

## 2023-02-09 ENCOUNTER — Ambulatory Visit: Payer: Medicaid Other | Admitting: Family Medicine

## 2023-02-09 ENCOUNTER — Encounter: Payer: Self-pay | Admitting: Family Medicine

## 2023-02-09 VITALS — BP 138/90 | HR 106 | Temp 97.9°F | Resp 20 | Wt 204.0 lb

## 2023-02-09 DIAGNOSIS — R040 Epistaxis: Secondary | ICD-10-CM

## 2023-02-09 DIAGNOSIS — K219 Gastro-esophageal reflux disease without esophagitis: Secondary | ICD-10-CM | POA: Diagnosis not present

## 2023-02-09 DIAGNOSIS — J3089 Other allergic rhinitis: Secondary | ICD-10-CM

## 2023-02-09 DIAGNOSIS — B999 Unspecified infectious disease: Secondary | ICD-10-CM

## 2023-02-09 DIAGNOSIS — J4540 Moderate persistent asthma, uncomplicated: Secondary | ICD-10-CM | POA: Diagnosis not present

## 2023-02-09 DIAGNOSIS — H1013 Acute atopic conjunctivitis, bilateral: Secondary | ICD-10-CM | POA: Diagnosis not present

## 2023-02-09 DIAGNOSIS — J302 Other seasonal allergic rhinitis: Secondary | ICD-10-CM

## 2023-02-09 DIAGNOSIS — Z91038 Other insect allergy status: Secondary | ICD-10-CM

## 2023-02-09 DIAGNOSIS — H1010 Acute atopic conjunctivitis, unspecified eye: Secondary | ICD-10-CM

## 2023-02-09 MED ORDER — EPIPEN 2-PAK 0.3 MG/0.3ML IJ SOAJ: 0.3000 mg | INTRAMUSCULAR | 1 refills | Status: DC

## 2023-02-09 MED ORDER — AZELASTINE HCL 0.1 % NA SOLN: NASAL | 5 refills | Status: DC

## 2023-02-09 MED ORDER — ALBUTEROL SULFATE HFA 108 (90 BASE) MCG/ACT IN AERS: INHALATION_SPRAY | RESPIRATORY_TRACT | 1 refills | Status: DC

## 2023-02-09 MED ORDER — CARBINOXAMINE MALEATE 4 MG PO TABS: 4.0000 mg | ORAL_TABLET | Freq: Three times a day (TID) | ORAL | 2 refills | Status: DC | PRN

## 2023-02-09 MED ORDER — SPIRIVA RESPIMAT 1.25 MCG/ACT IN AERS: 2.0000 | INHALATION_SPRAY | Freq: Every morning | RESPIRATORY_TRACT | 1 refills | Status: DC

## 2023-02-09 MED ORDER — OMEPRAZOLE 40 MG PO CPDR: 40.0000 mg | DELAYED_RELEASE_CAPSULE | Freq: Every day | ORAL | 5 refills | Status: AC

## 2023-02-09 NOTE — Patient Instructions (Addendum)
Asthma Continue Spiriva 2 puffs once a day to prevent cough or wheeze Continue albuterol 2 puffs once every 4 hours as needed for cough or wheeze You may use albuterol 2 puffs 5 to 15 minutes before activity to decrease cough or wheeze You do qualify for a biologic medication for asthma management if necessary. We will submit your information to your insurance for Xolair. You will next hear from our biologic medication coordinator Tammy with next steps for Xolair.   Allergic rhinitis Continue allergen avoidance measures directed toward grass pollen, weed pollen, tree pollen, mold, cat, dog, dust mite, and cockroach as listed below Continue carbinoxamine 4 mg tablets once every 6-8 hours as needed for a runny nose or itch Continue Nasacort 2 sprays in each nostril once a day as needed for stuffy nose Consider saline nasal rinses as needed for nasal symptoms. Use this before any medicated nasal sprays for best result Continue azelastine 2 sprays in each nostril up to twice a day as needed for a runny nose Consider nasal saline gel as needed for dry nostrils If your symptoms of allergic rhinitis are not well controlled with the treatment plan as listed above, consider allergen immunotherapy. Written information has been provided.   Allergic conjunctivitis Some over the counter eye drops include Pataday one drop in each eye once a day as needed for red, itchy eyes OR Zaditor one drop in each eye twice a day as needed for red itchy eyes.  Reflux Continue dietary and lifestyle modifications as listed below Continue omeprazole 40 mg twice a day to prevent reflux. Take this medication 30-60 minutes before your first meal for the best result We will refer you to a GI specialist for evaluation of not well controlled reflux  Epistaxis Pinch both nostrils while leaning forward for at least 5 minutes before checking to see if the bleeding has stopped. If bleeding is not controlled within 5-10 minutes  apply a cotton ball soaked with oxymetazoline (Afrin) to the bleeding nostril for a few seconds.  If the problem persists or worsens a referral to ENT for further evaluation may be necessary.  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  In case of an allergic reaction, take Benadryl 50 mg every 4 hours, and if life-threatening symptoms occur, inject with EpiPen 0.3 mg.  Recurrent infections Your pneumococcal titers were only protective to 4/23 serotypes. Get a PPSV or Prevnar 20 vaccine from your primary care provider. Please notify us when you get the vaccine. We will check labs about 4-6 weeks after you get the vaccine Call the clinic if this treatment plan is not working well for you. Keep track of infections, antibiotics, and steroids  Follow up in the clinic in the clinic in 3 months or sooner if needed.  Reducing Pollen Exposure The American Academy of Allergy, Asthma and Immunology suggests the following steps to reduce your exposure to pollen during allergy seasons. Do not hang sheets or clothing out to dry; pollen may collect on these items. Do not mow lawns or spend time around freshly cut grass; mowing stirs up pollen. Keep windows closed at night.  Keep car windows closed while driving. Minimize morning activities outdoors, a time when pollen counts are usually at their highest. Stay indoors as much as possible when pollen counts or humidity is high and on windy days when pollen tends to remain in the air longer. Use air conditioning when possible.  Many air conditioners have filters that trap the pollen spores.  Use a HEPA room air filter to remove pollen form the indoor air you breathe.  Control of Mold Allergen Mold and fungi can grow on a variety of surfaces provided certain temperature and moisture conditions exist.  Outdoor molds grow on plants, decaying vegetation and soil.  The major outdoor mold, Alternaria and Cladosporium, are found in very high numbers  during hot and dry conditions.  Generally, a late Summer - Fall peak is seen for common outdoor fungal spores.  Rain will temporarily lower outdoor mold spore count, but counts rise rapidly when the rainy period ends.  The most important indoor molds are Aspergillus and Penicillium.  Dark, humid and poorly ventilated basements are ideal sites for mold growth.  The next most common sites of mold growth are the bathroom and the kitchen.  Outdoor [employer] Use air conditioning and keep windows closed Avoid exposure to decaying vegetation. Avoid leaf raking. Avoid grain handling. Consider wearing a face mask if working in moldy areas.  Indoor Mold Control Maintain humidity below 50%. Clean washable surfaces with 5% bleach solution. Remove sources e.g. Contaminated carpets.   Control of Dust Mite Allergen Dust mites play a major role in allergic asthma and rhinitis. They occur in environments with high humidity wherever human skin is found. Dust mites absorb humidity from the atmosphere (ie, they do not drink) and feed on organic matter (including shed human and animal skin). Dust mites are a microscopic type of insect that you cannot see with the naked eye. High levels of dust mites have been detected from mattresses, pillows, carpets, upholstered furniture, bed covers, clothes, soft toys and any woven material. The principal allergen of the dust mite is found in its feces. A gram of dust may contain 1,000 mites and 250,000 fecal particles. Mite antigen is easily measured in the air during house cleaning activities. Dust mites do not bite and do not cause harm to humans, other than by triggering allergies/asthma.  Ways to decrease your exposure to dust mites in your home:  1. Encase mattresses, box springs and pillows with a mite-impermeable barrier or cover  2. Wash sheets, blankets and drapes weekly in hot water (130 F) with detergent and dry them in a dryer on the hot setting.  3. Have the  room cleaned frequently with a vacuum cleaner and a damp dust-mop. For carpeting or rugs, vacuuming with a vacuum cleaner equipped with a high-efficiency particulate air (HEPA) filter. The dust mite allergic individual should not be in a room which is being cleaned and should wait 1 hour after cleaning before going into the room.  4. Do not sleep on upholstered furniture (eg, couches).  5. If possible removing carpeting, upholstered furniture and drapery from the home is ideal. Horizontal blinds should be eliminated in the rooms where the person spends the most time (bedroom, study, television room). Washable vinyl, roller-type shades are optimal.  6. Remove all non-washable stuffed toys from the bedroom. Wash stuffed toys weekly like sheets and blankets above.  7. Reduce indoor humidity to less than 50%. Inexpensive humidity monitors can be purchased at most hardware stores. Do not use a humidifier as can make the problem worse and are not recommended.  Control of Dog or Cat Allergen Avoidance is the best way to manage a dog or cat allergy. If you have a dog or cat and are allergic to dog or cats, consider removing the dog or cat from the home. If you have a dog or cat but don't  want to find it a new home, or if your family wants a pet even though someone in the household is allergic, here are some strategies that may help keep symptoms at bay:  Keep the pet out of your bedroom and restrict it to only a few rooms. Be advised that keeping the dog or cat in only one room will not limit the allergens to that room. Don't pet, hug or kiss the dog or cat; if you do, wash your hands with soap and water. High-efficiency particulate air (HEPA) cleaners run continuously in a bedroom or living room can reduce allergen levels over time. Regular use of a high-efficiency vacuum cleaner or a central vacuum can reduce allergen levels. Giving your dog or cat a bath at least once a week can reduce airborne  allergen.  Control of Cockroach Allergen Cockroach allergen has been identified as an important cause of acute attacks of asthma, especially in urban settings.  There are fifty-five species of cockroach that exist in the [place], however only three, the [place], [place] species produce allergen that can affect patients with Asthma.  Allergens can be obtained from fecal particles, egg casings and secretions from cockroaches.    Remove food sources. Reduce access to water. Seal access and entry points. Spray runways with 0.5-1% Diazinon or Chlorpyrifos Blow boric acid power under stoves and refrigerator. Place bait stations (hydramethylnon) at feeding sites.

## 2023-02-09 NOTE — Progress Notes (Addendum)
[address] [place] [address] Dept: [phone]  FOLLOW UP NOTE  Patient ID: Linda Cochran, female    DOB: June 22, 1975  Age: 47 y.o. MRN: [id number] Date of Office Visit: 02/09/2023  Assessment  Chief Complaint: Follow-up (Spiriva has been helping. )  HPI Linda Cochran is a 47 year old female who presents to the clinic for follow-up visit.  She was last seen in this clinic on 12/20/2022 by Dr. Dellis Anes for evaluation of asthma, allergic rhinitis, recurrent infection, reflux, and recurrent thrush.  At today's visit, she reports her asthma has been moderately well-controlled with occasional wheezing at night and occasional dry cough with activity.  She continues Spiriva 1.25 mcg 2 puffs once a day and rarely uses albuterol for relief of symptoms.  Her last total IgE on 12/21/2022 was 238 and her environmental indoor allergies include dust mite, dog, cat, and cockroach.  She is interested in moving forward with Xolair for asthma control at this time.  Allergic rhinitis is reported as moderately well-controlled with symptoms including rhinorrhea and nasal congestion.  She continues carbinoxamine as needed and azelastine as needed with relief of symptoms.  Her last environmental allergy skin testing was on 07/15/2021 and was positive to grass pollen, weed pollen, ragweed pollen, tree pollen, mold, dust mite, cat, dog, and cockroach.  She is possibly interested in allergen immunotherapy once her breathing is well-controlled.  Reflux is reported as poorly controlled with heartburn occurring several days of the week.  She continues omeprazole 40 mg twice a day.  She reports that she takes this medication at least 30 minutes before a meal.  She is interested in a GI referral for evaluation of reflux.  Lab review from 12/21/2022 indicates that she was only protective to 2 out of 24 pneumococcal serotypes.  She reports that she is interested in getting a PPSV or Prevnar 20 vaccine.  She will  let us know when she gets the vaccine and we will do follow-up pneumococcal titers.  Tryptase was slightly elevated at her last visit as well as the visit previous to that.    Epistaxis is reported as moderately well-controlled with nosebleeds occurring about 2 days a month.  She reports these episodes can occur from either nostril and resolved in under 5 minutes.  She is not currently using a nasal steroid spray.  Her current medications are listed in the chart.   Drug Allergies:  Allergies  Allergen Reactions   Bupropion Other (See Comments)    Hallucinations   Fluoxetine     Other Reaction(s): Other  Depressed and suicidal   Methylprednisolone Other (See Comments)    Paralytic reaction   Codeine Hives and Itching   Dilaudid [Hydromorphone Hcl] Hives and Itching   Morphine And Codeine Hives and Itching   Pantoprazole Sodium Diarrhea and Nausea Only    Stomach cramps   Penicillins Hives   Sulfamethoxazole Other (See Comments)    Says she couldn't move.   Tramadol Diarrhea    Diarrhea (GI UPSET)   Trimethoprim Anxiety    Physical Exam: BP (!) 138/90   Pulse (!) 106   Temp 97.9 F (36.6 C)   Resp 20   Wt 204 lb (92.5 kg)   SpO2 96%   BMI 34.20 kg/m    Physical Exam Vitals reviewed.  Constitutional:      Appearance: Normal appearance.  HENT:     Head: Normocephalic and atraumatic.     Right Ear: Tympanic membrane normal.  Left Ear: Tympanic membrane normal.     Nose:     Comments: Bilateral naris slightly erythematous with thin clear nasal drainage noted.  Pharynx normal.  Ears normal.  Eyes normal.    Mouth/Throat:     Pharynx: Oropharynx is clear.  Eyes:     Conjunctiva/sclera: Conjunctivae normal.  Cardiovascular:     Rate and Rhythm: Normal rate and regular rhythm.     Heart sounds: Normal heart sounds. No murmur heard. Pulmonary:     Effort: Pulmonary effort is normal.     Breath sounds: Normal breath sounds.     Comments: Lungs clear to  auscultation Musculoskeletal:        General: Normal range of motion.     Cervical back: Normal range of motion and neck supple.  Skin:    General: Skin is warm and dry.  Neurological:     Mental Status: She is alert and oriented to person, place, and time.  Psychiatric:        Mood and Affect: Mood normal.        Behavior: Behavior normal.        Thought Content: Thought content normal.        Judgment: Judgment normal.     Diagnostics: FVC 3.04 which is 87% of predicted value, FEV1 2.43 which is 87% of predicted value.  Spirometry indicates normal ventilatory function.  Assessment and Plan: 1. Not well controlled moderate persistent asthma   2. Seasonal and perennial allergic rhinitis   3. Seasonal allergic conjunctivitis   4. Gastroesophageal reflux disease, unspecified whether esophagitis present   5. Epistaxis   6. Recurrent infections   7. Hymenoptera allergy     Meds ordered this encounter  Medications   albuterol (VENTOLIN HFA) 108 (90 Base) MCG/ACT inhaler    Sig: Inhale 2 puffs every 4-6 hours as needed for cough, wheeze, tightness in chest, or shortness of breath.    Dispense:  18 g    Refill:  1   Tiotropium Bromide Monohydrate (SPIRIVA RESPIMAT) 1.25 MCG/ACT AERS    Sig: Inhale 2 puffs into the lungs in the morning.    Dispense:  12 g    Refill:  1   Carbinoxamine Maleate 4 MG TABS    Sig: Take 1 tablet (4 mg total) by mouth every 8 (eight) hours as needed.    Dispense:  90 tablet    Refill:  2   azelastine (ASTELIN) 0.1 % nasal spray    Sig: Use 1 to 2 sprays in each nostril 1-2 times a day as needed for runny nose/drainage down throat    Dispense:  30 mL    Refill:  5   omeprazole (PRILOSEC) 40 MG capsule    Sig: Take 1 capsule (40 mg total) by mouth daily.    Dispense:  30 capsule    Refill:  5   EPIPEN 2-PAK 0.3 MG/0.3ML SOAJ injection    Sig: Inject 0.3 mg into the muscle as directed.    Dispense:  1 each    Refill:  1    Patient Instructions   Asthma Continue Spiriva 2 puffs once a day to prevent cough or wheeze Continue albuterol 2 puffs once every 4 hours as needed for cough or wheeze You may use albuterol 2 puffs 5 to 15 minutes before activity to decrease cough or wheeze You do qualify for a biologic medication for asthma management if necessary. We will submit your information to your insurance for Xolair. You  will next hear from our biologic medication coordinator Tammy with next steps for Xolair.   Allergic rhinitis Continue allergen avoidance measures directed toward grass pollen, weed pollen, tree pollen, mold, cat, dog, dust mite, and cockroach as listed below Continue carbinoxamine 4 mg tablets once every 6-8 hours as needed for a runny nose or itch Continue Nasacort 2 sprays in each nostril once a day as needed for stuffy nose Consider saline nasal rinses as needed for nasal symptoms. Use this before any medicated nasal sprays for best result Continue azelastine 2 sprays in each nostril up to twice a day as needed for a runny nose Consider nasal saline gel as needed for dry nostrils If your symptoms of allergic rhinitis are not well controlled with the treatment plan as listed above, consider allergen immunotherapy. Written information has been provided.   Allergic conjunctivitis Some over the counter eye drops include Pataday one drop in each eye once a day as needed for red, itchy eyes OR Zaditor one drop in each eye twice a day as needed for red itchy eyes.  Reflux Continue dietary and lifestyle modifications as listed below Continue omeprazole 40 mg twice a day to prevent reflux. Take this medication 30-60 minutes before your first meal for the best result We will refer you to a GI specialist for evaluation of not well controlled reflux  Epistaxis Pinch both nostrils while leaning forward for at least 5 minutes before checking to see if the bleeding has stopped. If bleeding is not controlled within 5-10 minutes  apply a cotton ball soaked with oxymetazoline (Afrin) to the bleeding nostril for a few seconds.  If the problem persists or worsens a referral to ENT for further evaluation may be necessary.  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  In case of an allergic reaction, take Benadryl 50 mg every 4 hours, and if life-threatening symptoms occur, inject with EpiPen 0.3 mg.  Recurrent infections Your pneumococcal titers were only protective to 4/23 serotypes. Get a PPSV or Prevnar 20 vaccine from your primary care provider. Please notify us when you get the vaccine. We will check labs about 4-6 weeks after you get the vaccine Call the clinic if this treatment plan is not working well for you. Keep track of infections, antibiotics, and steroids  Follow up in the clinic in the clinic in 3 months or sooner if needed.   Return in about 3 months (around 05/12/2023), or if symptoms worsen or fail to improve.    Thank you for the opportunity to care for this patient.  Please do not hesitate to contact me with questions.  Thermon Leyland, FNP [facility]

## 2023-02-23 ENCOUNTER — Other Ambulatory Visit (HOSPITAL_COMMUNITY): Payer: Self-pay

## 2023-02-23 ENCOUNTER — Other Ambulatory Visit: Payer: Self-pay

## 2023-02-23 ENCOUNTER — Telehealth: Payer: Self-pay | Admitting: *Deleted

## 2023-02-23 MED ORDER — OMALIZUMAB 150 MG/ML ~~LOC~~ SOSY
300.0000 mg | PREFILLED_SYRINGE | SUBCUTANEOUS | 11 refills | Status: AC
  Filled 2023-02-23 (×2): qty 4, 28d supply, fill #0
  Filled 2023-04-11: qty 4, 28d supply, fill #1
  Filled 2023-05-16: qty 4, 28d supply, fill #2
  Filled 2023-07-03: qty 4, 28d supply, fill #3
  Filled 2023-11-02 – 2024-01-02 (×2): qty 4, 28d supply, fill #4

## 2023-02-23 NOTE — Telephone Encounter (Signed)
Thank you :)

## 2023-02-23 NOTE — Telephone Encounter (Signed)
Called patient and advised approval and submit for Xolair for asthma to [facility]. Will reach out once delivery set to make appt to start therapy

## 2023-02-23 NOTE — Telephone Encounter (Signed)
-----   Message from Thermon Leyland sent at 02/09/2023 12:41 PM EDT ----- Hi Josean Lycan, Can you please submit this patient for Xolair for asthma control? She has tried and [place] and is currently on Spiriva. Her last IgE was 238 on 12/21/2022. Thank you

## 2023-02-26 ENCOUNTER — Other Ambulatory Visit (HOSPITAL_COMMUNITY): Payer: Self-pay

## 2023-03-12 ENCOUNTER — Ambulatory Visit: Payer: Medicaid Other

## 2023-03-21 ENCOUNTER — Other Ambulatory Visit (HOSPITAL_COMMUNITY): Payer: Self-pay

## 2023-03-23 ENCOUNTER — Ambulatory Visit: Payer: Medicaid Other

## 2023-03-23 DIAGNOSIS — J4540 Moderate persistent asthma, uncomplicated: Secondary | ICD-10-CM | POA: Diagnosis not present

## 2023-03-23 MED ORDER — OMALIZUMAB 150 MG/ML ~~LOC~~ SOSY
300.0000 mg | PREFILLED_SYRINGE | SUBCUTANEOUS | Status: AC
Start: 2023-03-23 — End: ?
  Administered 2023-03-23 – 2023-09-05 (×8): 300 mg via SUBCUTANEOUS

## 2023-03-23 NOTE — Progress Notes (Signed)
Immunotherapy   Patient Details  Name: [name] MRN: [id number] Date of Birth: July 25, 1975  03/23/2023  [name] started injections for  Xolair  Following schedule: Every fourteen days.  Frequency: Every two weeks.  Epi-Pen:Epi-Pen Available  Consent signed in office and patient instructions given. Patient sat in the lobby for thirty minutes without an issue. Patient expressed that her friend will be coming in next time and will be administering her next injection.    [name] 03/23/2023, 11:35 AM

## 2023-04-11 ENCOUNTER — Other Ambulatory Visit: Payer: Self-pay

## 2023-04-11 ENCOUNTER — Ambulatory Visit (INDEPENDENT_AMBULATORY_CARE_PROVIDER_SITE_OTHER): Payer: Medicaid Other

## 2023-04-11 DIAGNOSIS — J4540 Moderate persistent asthma, uncomplicated: Secondary | ICD-10-CM

## 2023-04-11 NOTE — Progress Notes (Signed)
Specialty Pharmacy Refill Coordination Note  Linda Cochran is a 47 y.o. female contacted today regarding refills of specialty medication(s) Omalizumab   Patient requested Courier to Provider Office   Delivery date: 04/23/23   Verified address: [facility], [address] [place], [place], [address]   Medication will be filled on 04/20/23.

## 2023-04-12 ENCOUNTER — Encounter: Payer: Self-pay | Admitting: Family Medicine

## 2023-04-12 NOTE — Telephone Encounter (Signed)
Per 02/09/23 office visit:  Reflux Continue dietary and lifestyle modifications as listed below Continue omeprazole 40 mg twice a day to prevent reflux. Take this medication 30-60 minutes before your first meal for the best result We will refer you to a GI specialist for evaluation of not well controlled reflux   Forwarding message to referral team to process as urgent.  Sent updated myChart message to patient as well.

## 2023-04-25 ENCOUNTER — Ambulatory Visit: Payer: Medicaid Other

## 2023-05-02 ENCOUNTER — Ambulatory Visit: Payer: Medicaid Other

## 2023-05-02 DIAGNOSIS — J4540 Moderate persistent asthma, uncomplicated: Secondary | ICD-10-CM

## 2023-05-14 ENCOUNTER — Ambulatory Visit: Payer: Medicaid Other

## 2023-05-14 DIAGNOSIS — J4540 Moderate persistent asthma, uncomplicated: Secondary | ICD-10-CM

## 2023-05-16 ENCOUNTER — Other Ambulatory Visit: Payer: Self-pay

## 2023-05-16 ENCOUNTER — Other Ambulatory Visit (HOSPITAL_COMMUNITY): Payer: Self-pay | Admitting: Pharmacy Technician

## 2023-05-16 ENCOUNTER — Other Ambulatory Visit (HOSPITAL_COMMUNITY): Payer: Self-pay

## 2023-05-16 NOTE — Progress Notes (Signed)
Specialty Pharmacy Refill Coordination Note  Linda Cochran is a 47 y.o. female contacted today regarding refills of specialty medication(s) Omalizumab   Patient requested Courier to Provider Office   Delivery date: 05/22/23   Verified address: [facility] [address]   Medication will be filled on 05/21/23.

## 2023-05-21 ENCOUNTER — Other Ambulatory Visit: Payer: Self-pay

## 2023-05-28 ENCOUNTER — Ambulatory Visit: Payer: Medicaid Other

## 2023-05-28 ENCOUNTER — Encounter: Payer: Self-pay | Admitting: Internal Medicine

## 2023-05-28 ENCOUNTER — Ambulatory Visit: Payer: Medicaid Other | Admitting: Internal Medicine

## 2023-05-28 VITALS — BP 116/88 | HR 107 | Temp 97.7°F | Resp 20 | Wt 207.5 lb

## 2023-05-28 DIAGNOSIS — B999 Unspecified infectious disease: Secondary | ICD-10-CM | POA: Diagnosis not present

## 2023-05-28 DIAGNOSIS — K219 Gastro-esophageal reflux disease without esophagitis: Secondary | ICD-10-CM | POA: Diagnosis not present

## 2023-05-28 DIAGNOSIS — J4540 Moderate persistent asthma, uncomplicated: Secondary | ICD-10-CM

## 2023-05-28 DIAGNOSIS — J3089 Other allergic rhinitis: Secondary | ICD-10-CM

## 2023-05-28 DIAGNOSIS — J302 Other seasonal allergic rhinitis: Secondary | ICD-10-CM

## 2023-05-28 MED ORDER — ALBUTEROL SULFATE HFA 108 (90 BASE) MCG/ACT IN AERS: 1.0000 | INHALATION_SPRAY | Freq: Four times a day (QID) | RESPIRATORY_TRACT | 1 refills | Status: AC | PRN

## 2023-05-28 MED ORDER — MONTELUKAST SODIUM 10 MG PO TABS: 10.0000 mg | ORAL_TABLET | Freq: Every evening | ORAL | 5 refills | Status: AC

## 2023-05-28 MED ORDER — AZELASTINE HCL 0.1 % NA SOLN: 2.0000 | Freq: Two times a day (BID) | NASAL | 5 refills | Status: AC | PRN

## 2023-05-28 MED ORDER — SPIRIVA RESPIMAT 1.25 MCG/ACT IN AERS: 2.0000 | INHALATION_SPRAY | Freq: Every morning | RESPIRATORY_TRACT | 5 refills | Status: AC

## 2023-05-28 MED ORDER — CARBINOXAMINE MALEATE ER 4 MG/5ML PO SUER: 4.0000 mg | Freq: Two times a day (BID) | ORAL | 3 refills | Status: AC | PRN

## 2023-05-28 MED ORDER — EPIPEN 2-PAK 0.3 MG/0.3ML IJ SOAJ: 0.3000 mg | INTRAMUSCULAR | 1 refills | Status: AC

## 2023-05-28 NOTE — Progress Notes (Addendum)
FOLLOW UP Date of Service/Encounter:  05/28/23   Subjective:  Linda Cochran (DOB: 1975/08/07) is a 47 y.o. female who [facility] for follow up for asthma, allergic rhinitis, recurrent infections, reflux.   History obtained from: chart review and patient. Last seen by Thurston Hole on 02/09/2023 and due to uncontrolled asthma, discussed starting Xolair.  Also on Azelastine, Karbinal, PPI BID.  Was referred to GI.  Discussed Pneumovax vaccine with repeat titers.   Asthma Control Test: ACT Total Score: 20.    Since last visit, reports asthma is doing much better.  Not as much dyspnea/wheezing.  Did have to use albuterol x 1 recently due to exposure to cold weather.  Otherwise, no ER visits/oral prednisone use.  Xolair started 03/2023.   Allergies are usually worse in Spring/Summer.  Currently doing well and only use nose spray/Karbinal PRN. Also on Singulair daily.   Reflux remains uncontrolled despite BID PPI use.  GI referral pending.   No episodes of thrush.  No other infections.  Did get [facility]neum[facility] shot at [facility] month ago, not sure which one.    Past Medical History: Past Medical History:  Diagnosis Date   Acid reflux    Chickenpox    Depression    IBS (irritable bowel syndrome)    Migraine aura, persistent    Papilloma    anus   Vitamin D deficiency     Objective:  BP 116/88   Pulse (!) 107   Temp 97.7 F (36.5 C)   Resp 20   Wt 207 lb 8 oz (94.1 kg)   SpO2 98%   BMI 34.78 kg/m  Body mass index is 34.78 kg/m. Physical Exam: GEN: alert, well developed HEENT: clear conjunctiva, nose with mild inferior turbinate hypertrophy, pink nasal mucosa, + clear rhinorrhea, no cobblestoning HEART: regular rate and rhythm, no murmur LUNGS: clear to auscultation bilaterally, no coughing, unlabored respiration SKIN: no rashes or lesions  Spirometry:  Tracings reviewed. Her effort: Good reproducible efforts. FVC: 3.06L, 88% predicted  FEV1:  2.44L, 88% predicted FEV1/FVC ratio: 80% Interpretation: Spirometry consistent with normal pattern.  Please see scanned spirometry results for details.   Assessment:   1. Seasonal and perennial allergic rhinitis   2. Recurrent infections   3. Moderate persistent asthma without complication   4. Gastroesophageal reflux disease, unspecified whether esophagitis present     Plan/Recommendations:  Moderate Persistent Asthma - Spirometry today was normal.  Well controlled. MDI technique discussed.  - Maintenance inhaler: continue Spiriva 2 puffs daily and Xolair 300mg  every 14 days.  Keep Epipen. - Rescue inhaler: Albuterol 2 puffs via spacer or 1 vial via nebulizer every 4-6 hours as needed for respiratory symptoms of cough, shortness of breath,  wheezing Asthma control goals:  Full participation in all desired activities (may need albuterol before activity) Albuterol use two times or less a week on average (not counting use with activity) Cough interfering with sleep two times or less a month Oral steroids no more than once a year No hospitalizations  Allergic rhinitis - Controlled  - SPT 07/2021: positive to grass pollen, weed pollen, tree pollen, mold, cat, dog, dust mite, and cockroach as listed below - Consider saline nasal rinses as needed for nasal symptoms. Use this before any medicated nasal sprays for best result - Continue Nasacort 2 sprays in each nostril once a day as needed for stuffy nose - Continue Azelastine 2 sprays in each nostril up to twice a day as  needed for a runny nose - Continue Carbinoxamine 4 mg tablets once every 8 hours as needed for a runny nose or itch - Continue Singulair 10mg  daily.  Stop if you have any mood/behavioral changes.   - Some over the counter eye drops include Pataday one drop in each eye once a day as needed for red, itchy eyes OR Zaditor one drop in each eye twice a day as needed for red itchy eyes. - If your symptoms of allergic rhinitis  are not well controlled with the treatment plan as listed above, consider allergen immunotherapy.   GERD - Uncontrolled - Continue omeprazole 40 mg twice a day to prevent reflux. Take this medication 30-60 minutes before your first meal for the best result - Follow up with GI-referral pending.  -Avoid lying down for at least two hours after a meal or after drinking acidic beverages, like soda, or other caffeinated beverages. This can help to prevent stomach contents from flowing back into the esophagus. -Keep your head elevated while you sleep. Using an extra pillow or two can also help to prevent reflux. -Eat smaller and more frequent meals each day instead of a few large meals. This promotes digestion and can aid in preventing heartburn. -Wear loose-fitting clothes to ease pressure on the stomach, which can worsen heartburn and reflux. -Reduce excess weight around the midsection. This can ease pressure on the stomach. Such pressure can force some stomach contents back up the esophagus.   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   - In case of an allergic reaction, take Benadryl 25 mg every 4 hours as needed, and if life-threatening symptoms occur, inject with EpiPen 0.3 mg.  Elevated Tryptase - Will recheck tryptase and C kit mutation.  Recurrent infections - Your pneumococcal titers were only protective to 4/23 serotypes. [facility]bt[facility] about [facility] weeks ago.  In 1-2 weeks, please get labwork done to recheck S pneumo titers.  - Discussed recurrent thrush would not be due to an immune deficiency; has had normal lymphocytes/CD4/CD3 count  - Keep track of infections, antibiotics, and steroids      Return in about 3 months (around 08/26/2023).  Alesia Morin, MD [facility]

## 2023-05-28 NOTE — Patient Instructions (Addendum)
Moderate Persistent Asthma - Maintenance inhaler: continue Spiriva 2 puffs daily and Xolair 300mg  every 14 days.  Keep Epipen. - Rescue inhaler: Albuterol 2 puffs via spacer or 1 vial via nebulizer every 4-6 hours as needed for respiratory symptoms of cough, shortness of breath, or wheezing Asthma control goals:  Full participation in all desired activities (may need albuterol before activity) Albuterol use two times or less a week on average (not counting use with activity) Cough interfering with sleep two times or less a month Oral steroids no more than once a year No hospitalizations  Allergic rhinitis - SPT 07/2021: positive to grass pollen, weed pollen, tree pollen, mold, cat, dog, dust mite, and cockroach as listed below - Consider saline nasal rinses as needed for nasal symptoms. Use this before any medicated nasal sprays for best result - Continue Nasacort 2 sprays in each nostril once a day as needed for stuffy nose - Continue Azelastine 2 sprays in each nostril up to twice a day as needed for a runny nose - Continue Carbinoxamine 4 mg tablets once every 8 hours as needed for a runny nose or itch - Continue Singulair 10mg  daily.  Stop if you have any mood/behavioral changes.   - Some over the counter eye drops include Pataday one drop in each eye once a day as needed for red, itchy eyes OR Zaditor one drop in each eye twice a day as needed for red itchy eyes. - If your symptoms of allergic rhinitis are not well controlled with the treatment plan as listed above, consider allergen immunotherapy.   GERD - Continue omeprazole 40 mg twice a day to prevent reflux. Take this medication 30-60 minutes before your first meal for the best result - Follow up with GI referral.   -Avoid lying down for at least two hours after a meal or after drinking acidic beverages, like soda, or other caffeinated beverages. This can help to prevent stomach contents from flowing back into the esophagus. -Keep  your head elevated while you sleep. Using an extra pillow or two can also help to prevent reflux. -Eat smaller and more frequent meals each day instead of a few large meals. This promotes digestion and can aid in preventing heartburn. -Wear loose-fitting clothes to ease pressure on the stomach, which can worsen heartburn and reflux. -Reduce excess weight around the midsection. This can ease pressure on the stomach. Such pressure can force some stomach contents back up the esophagus.   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   - In case of an allergic reaction, take Benadryl 25 mg every 4 hours as needed, and if life-threatening symptoms occur, inject with EpiPen 0.3 mg.  Elevated Tryptase - Will recheck tryptase and C kit mutation.  Recurrent infections - Your pneumococcal titers were only protective to 4/23 serotypes. [facility].  In 1-2 weeks, please get labwork done to recheck S pneumo titers.  - Keep track of infections, antibiotics, and steroids

## 2023-05-28 NOTE — Addendum Note (Signed)
Addended by: Elsworth Soho on: 05/28/2023 04:55 PM   Modules accepted: Orders

## 2023-06-11 ENCOUNTER — Ambulatory Visit: Payer: Medicaid Other

## 2023-06-12 ENCOUNTER — Other Ambulatory Visit (HOSPITAL_COMMUNITY): Payer: Self-pay

## 2023-06-20 ENCOUNTER — Ambulatory Visit: Payer: Medicaid Other

## 2023-06-22 ENCOUNTER — Other Ambulatory Visit: Payer: Self-pay

## 2023-06-27 ENCOUNTER — Ambulatory Visit: Payer: Medicaid Other

## 2023-06-27 DIAGNOSIS — J4540 Moderate persistent asthma, uncomplicated: Secondary | ICD-10-CM

## 2023-07-03 ENCOUNTER — Other Ambulatory Visit: Payer: Self-pay

## 2023-07-03 NOTE — Progress Notes (Signed)
Specialty Pharmacy Refill Coordination Note  Linda Cochran is a 48 y.o. female contacted today regarding refills of specialty medication(s) Omalizumab [name])   Patient requested Courier to Provider Office   Delivery date: 07/04/23   Verified address: [facility] [address]   Medication will be filled on 01.21.25.

## 2023-07-11 ENCOUNTER — Ambulatory Visit: Payer: Medicaid Other

## 2023-07-23 ENCOUNTER — Ambulatory Visit: Payer: Medicaid Other

## 2023-07-31 ENCOUNTER — Other Ambulatory Visit: Payer: Self-pay

## 2023-08-01 ENCOUNTER — Ambulatory Visit: Payer: Medicaid Other

## 2023-08-15 ENCOUNTER — Ambulatory Visit: Payer: Medicaid Other

## 2023-08-15 DIAGNOSIS — J4540 Moderate persistent asthma, uncomplicated: Secondary | ICD-10-CM | POA: Diagnosis not present

## 2023-08-23 ENCOUNTER — Other Ambulatory Visit (HOSPITAL_COMMUNITY): Payer: Self-pay

## 2023-08-29 ENCOUNTER — Ambulatory Visit

## 2023-08-30 ENCOUNTER — Other Ambulatory Visit (HOSPITAL_COMMUNITY): Payer: Self-pay

## 2023-09-05 ENCOUNTER — Ambulatory Visit

## 2023-09-05 DIAGNOSIS — J4540 Moderate persistent asthma, uncomplicated: Secondary | ICD-10-CM

## 2023-09-05 NOTE — Progress Notes (Signed)
 Immunotherapy   Patient Details  Name: Linda Cochran MRN: [id number] Date of Birth: 1976/05/04  09/05/2023  Linda Cochran started injections for  Xolair teaching Following schedule: Every fourteen days Frequency: Every two weeks Epi-Pen:Epi-Pen Available  Consent signed previously and patient instructions given on how to inject herself with Xolair in the thigh and abdomen. Patient successfully administered herself with the Xolair in her right thigh.    [name] 09/05/2023, 2:04 PM

## 2023-09-17 ENCOUNTER — Ambulatory Visit: Payer: Medicaid Other | Admitting: Internal Medicine

## 2023-11-02 ENCOUNTER — Other Ambulatory Visit (HOSPITAL_COMMUNITY): Payer: Self-pay

## 2023-11-02 ENCOUNTER — Other Ambulatory Visit: Payer: Self-pay

## 2023-11-02 NOTE — Progress Notes (Signed)
 Specialty Pharmacy Refill Coordination Note  Linda Cochran is a 48 y.o. female contacted today regarding refills of specialty medication(s) Omalizumab  (XOLAIR )   Patient requested [name] at [facility] at [place] date: 11/07/23   Medication will be filled on 11/02/23.

## 2023-11-02 NOTE — Progress Notes (Signed)
 Specialty Pharmacy Ongoing Clinical Assessment Note  Linda Cochran is a 48 y.o. female who is being followed by the specialty pharmacy service for RxSp Asthma/COPD   Patient's specialty medication(s) reviewed today: Omalizumab  (XOLAIR )   Missed doses in the last 4 weeks: 3 (due to insurace)   Patient/Caregiver did not have any additional questions or concerns.   Therapeutic benefit summary: Unable to assess   Adverse events/side effects summary: No adverse events/side effects (no adverse events in the past)   Patient's therapy is appropriate to: Continue    Goals Addressed             This Visit's Progress    Reduce disease symptoms including coughing and shortness of breath       Patient is initiating therapy. Patient will maintain adherence.  Patient had a gap in coverage, but now has insurance again and is restarting her dosing. Previously saw benefit when compliant to therapy.          Follow up: 6 months  Malachi Screws Specialty Pharmacist

## 2023-11-14 ENCOUNTER — Other Ambulatory Visit (HOSPITAL_COMMUNITY): Payer: Self-pay

## 2023-11-16 ENCOUNTER — Other Ambulatory Visit: Payer: Self-pay

## 2023-11-16 ENCOUNTER — Other Ambulatory Visit (HOSPITAL_COMMUNITY): Payer: Self-pay

## 2023-11-16 NOTE — Progress Notes (Signed)
 Returning tezspire to stock at patient request. Patient medicaid plan is now under family planning and she wants to get resolved before filling. Patient will call back with update. Spec pharm should check insurance before attempting to fill again.

## 2023-11-19 ENCOUNTER — Ambulatory Visit: Admitting: Allergy & Immunology

## 2023-11-19 ENCOUNTER — Other Ambulatory Visit (HOSPITAL_COMMUNITY): Payer: Self-pay

## 2023-11-20 ENCOUNTER — Other Ambulatory Visit (HOSPITAL_COMMUNITY): Payer: Self-pay

## 2023-11-22 ENCOUNTER — Other Ambulatory Visit (HOSPITAL_COMMUNITY): Payer: Self-pay

## 2023-11-24 ENCOUNTER — Other Ambulatory Visit (HOSPITAL_COMMUNITY): Payer: Self-pay

## 2023-11-24 IMAGING — MG DIGITAL DIAGNOSTIC BILAT W/ TOMO W/ CAD
6 of 12 series · 6 of 36 positions shown · non-contrast
Comparison: Previous exam(s).

CLINICAL DATA: 45-year-old female presenting for evaluation of 1
month of right breast pain.

EXAM:
DIGITAL DIAGNOSTIC BILATERAL MAMMOGRAM WITH TOMOSYNTHESIS AND CAD;
ULTRASOUND RIGHT BREAST LIMITED
TECHNIQUE: Bilateral digital diagnostic mammography and breast tomosynthesis
was performed. The images were evaluated with computer-aided
detection.; Targeted ultrasound examination of the right breast was
performed

[R TAN synth-2D (1 of 2)]
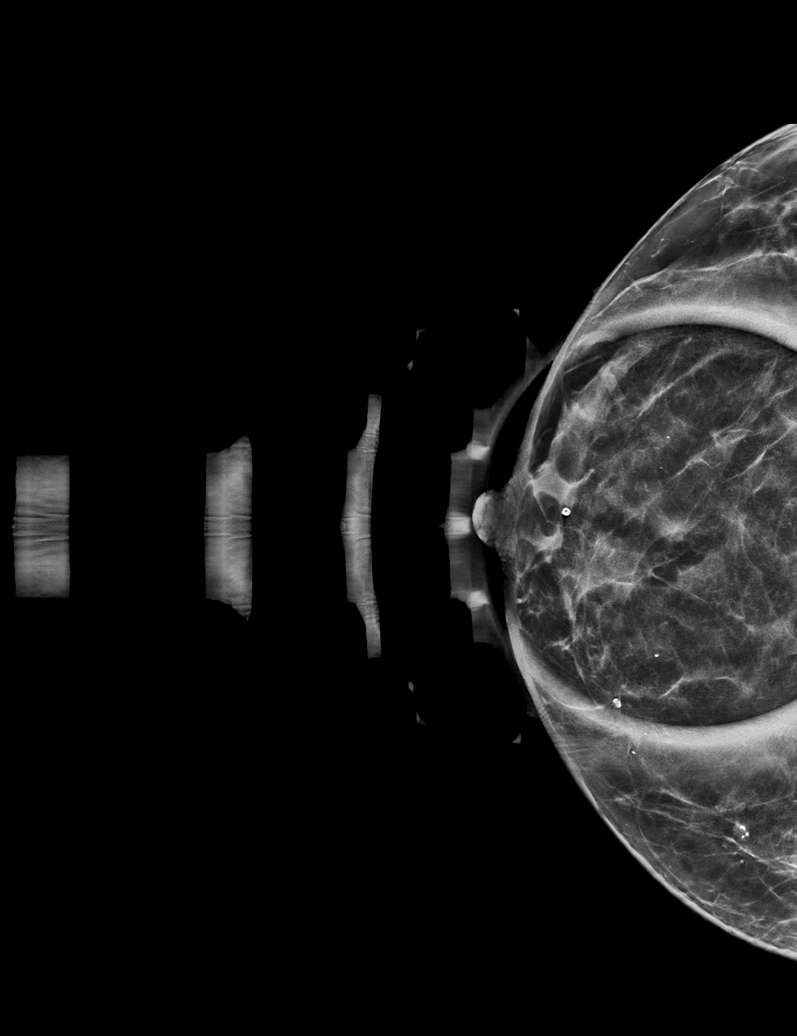

[R CC synth-2D]
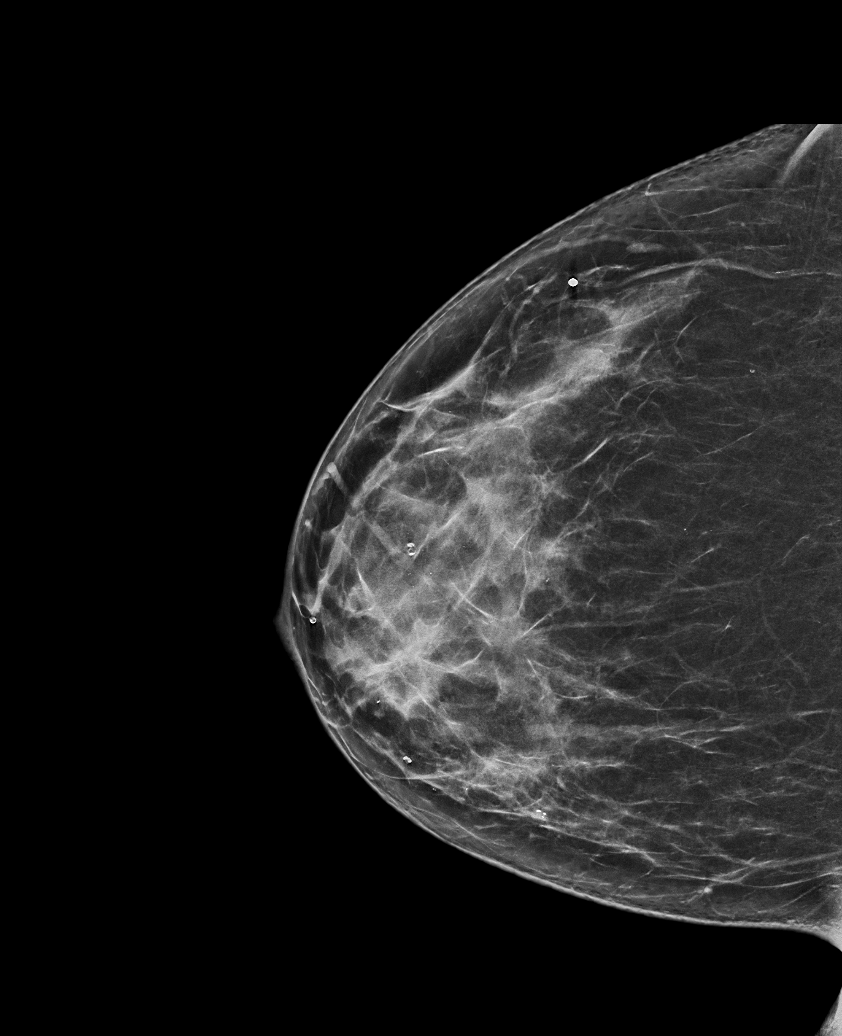

[R TAN synth-2D (2 of 2)]
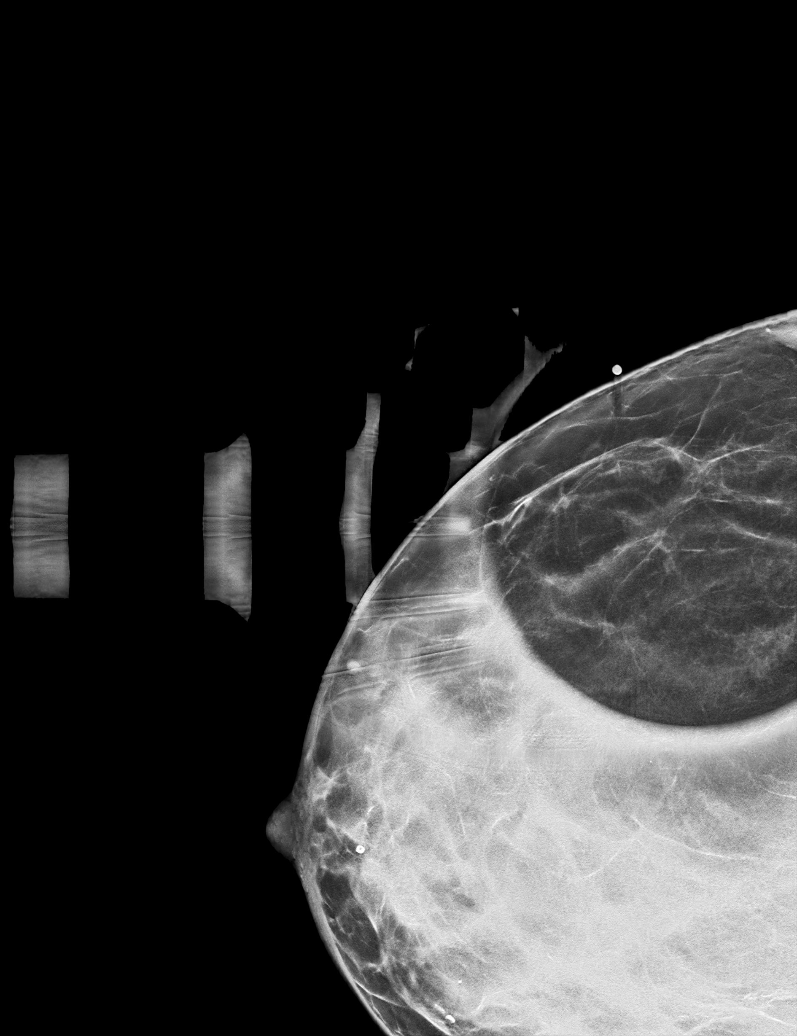

[L MLO synth-2D]
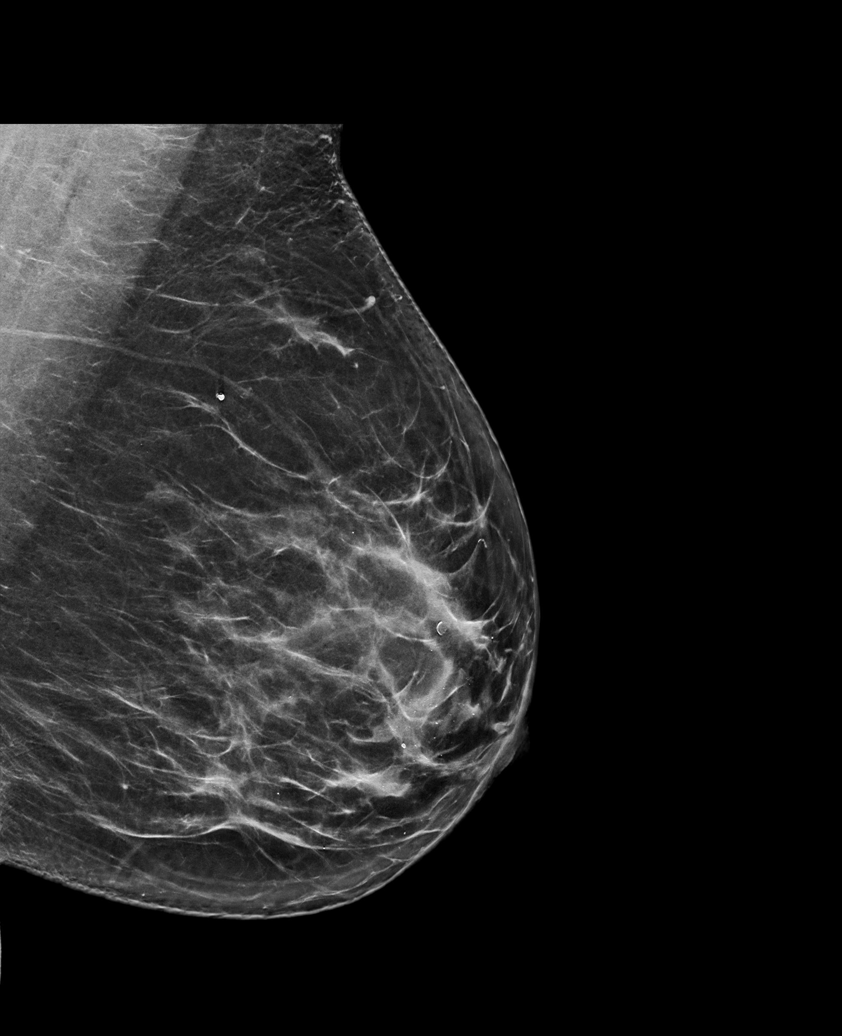

[R MLO synth-2D]
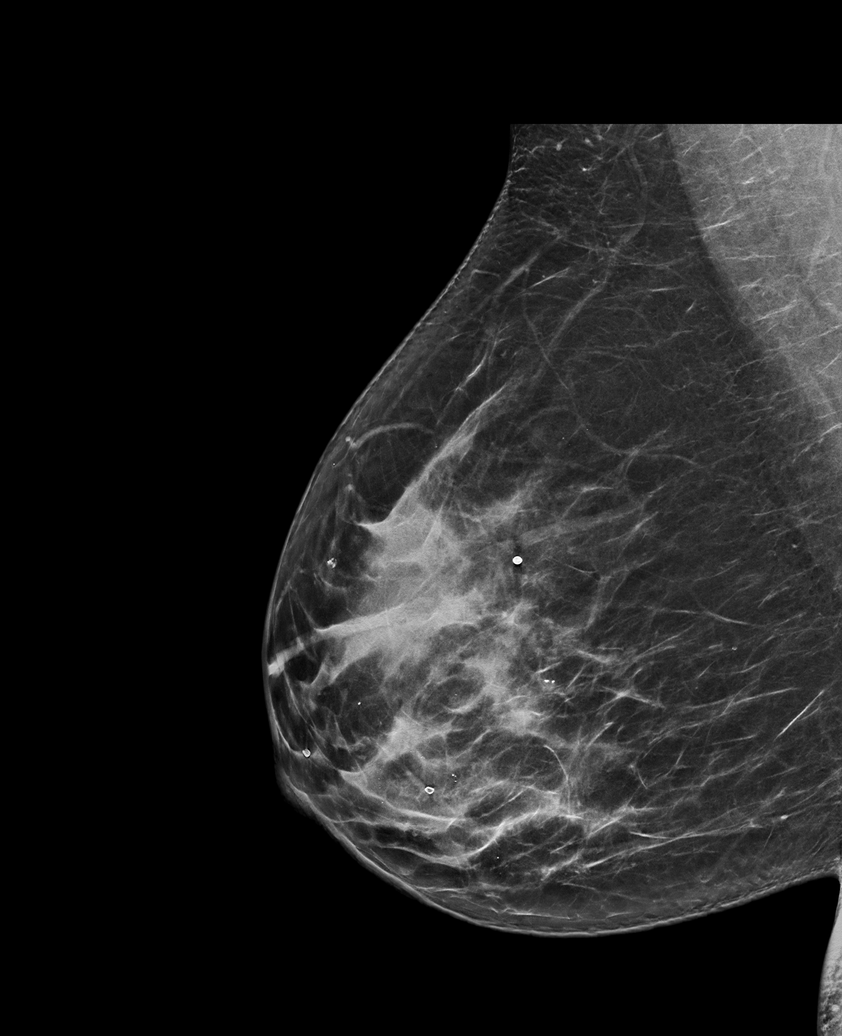

[L CC synth-2D]
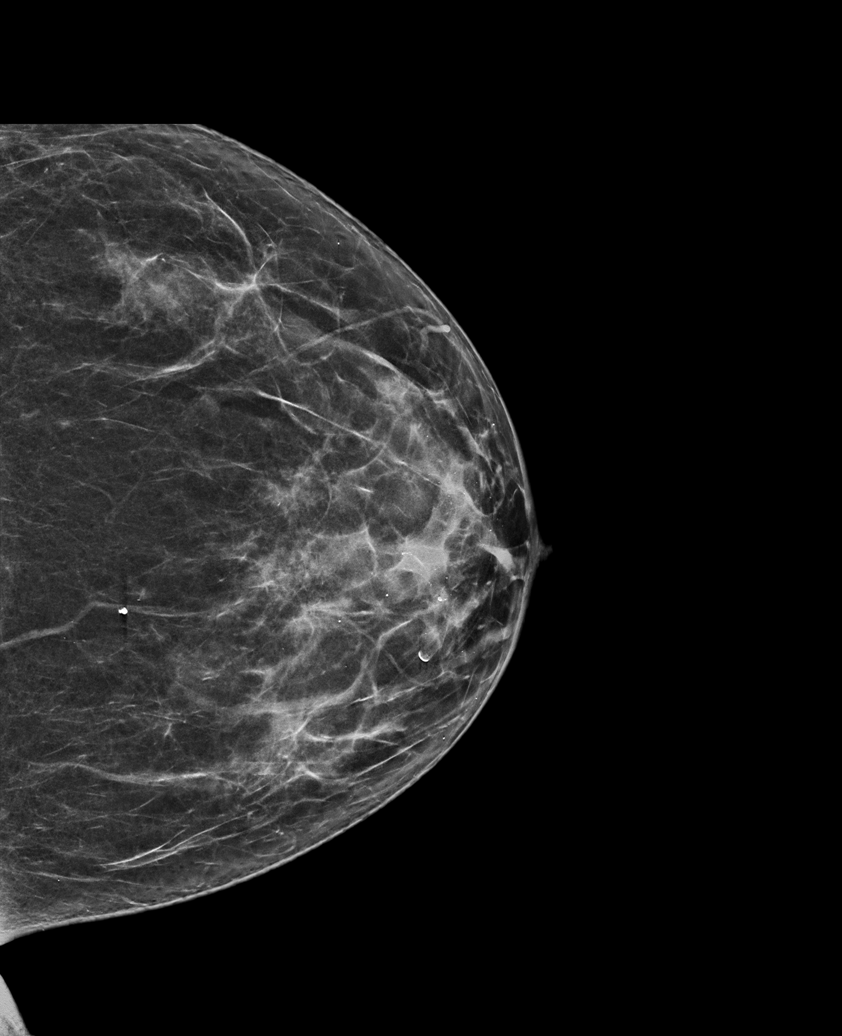

[6 of 36 positions shown; findings below may reference images not displayed]

ACR Breast Density Category c: The breast tissue is heterogeneously
dense, which may obscure small masses.
FINDINGS: No suspicious calcifications, masses or areas of distortion are seen
in the bilateral breasts.

Ultrasound targeted to the upper outer right breast demonstrates
normal dense fibroglandular tissue. No suspicious masses or areas of
shadowing are identified.
IMPRESSION: 1. There are no suspicious mammographic or targeted sonographic
abnormalities in the right breast to correspond with the patient's
pain.

2.  No mammographic evidence of malignancy in the bilateral breasts.

RECOMMENDATION:
1. Clinical follow-up recommended for the tender area of concern in
the right breast. Any further workup should be based on clinical
grounds.

2.  Screening mammogram in one year.(Code:[id number])

I have discussed the findings and recommendations with the patient.
If applicable, a reminder letter will be sent to the patient
regarding the next appointment.

BI-RADS CATEGORY  1: Negative.

## 2023-11-28 ENCOUNTER — Other Ambulatory Visit (HOSPITAL_COMMUNITY): Payer: Self-pay

## 2023-11-28 ENCOUNTER — Other Ambulatory Visit: Payer: Self-pay

## 2023-12-03 ENCOUNTER — Other Ambulatory Visit: Payer: Self-pay

## 2024-01-02 ENCOUNTER — Other Ambulatory Visit: Payer: Self-pay

## 2024-01-04 ENCOUNTER — Other Ambulatory Visit (HOSPITAL_COMMUNITY): Payer: Self-pay

## 2024-01-18 ENCOUNTER — Telehealth: Payer: Self-pay | Admitting: Internal Medicine

## 2024-01-18 NOTE — Telephone Encounter (Signed)
 Called patient to schedule Xolair  reapproval. Patient has not been on Xolair  due to losing her insurance. She is in the process of moving and if interested, will call us  back to schedule an appointment.

## 2024-04-15 ENCOUNTER — Other Ambulatory Visit: Payer: Self-pay

## 2024-04-15 ENCOUNTER — Other Ambulatory Visit: Payer: Self-pay | Admitting: Pharmacy Technician

## 2024-04-15 NOTE — Progress Notes (Signed)
 Disenrolled; Last Filled 07/03/2023
